# Patient Record
Sex: Male | Born: 1935 | Race: White | Hispanic: No | Marital: Married | Smoking: Never smoker
Health system: Southern US, Community
[De-identification: ages and names within clinical notes are randomized; demographics above are authoritative.]

## PROBLEM LIST (undated history)

## (undated) DIAGNOSIS — I10 Essential (primary) hypertension: Secondary | ICD-10-CM

## (undated) DIAGNOSIS — E119 Type 2 diabetes mellitus without complications: Secondary | ICD-10-CM

## (undated) HISTORY — PX: NEPHROSTOMY: SHX1014

## (undated) HISTORY — PX: COLON SURGERY: SHX602

## (undated) HISTORY — PX: OTHER SURGICAL HISTORY: SHX169

---

## 2009-06-16 ENCOUNTER — Ambulatory Visit: Payer: Self-pay | Admitting: Diagnostic Radiology

## 2009-06-16 ENCOUNTER — Emergency Department (HOSPITAL_BASED_OUTPATIENT_CLINIC_OR_DEPARTMENT_OTHER): Admission: EM | Admit: 2009-06-16 | Discharge: 2009-06-16 | Payer: Self-pay | Admitting: Emergency Medicine

## 2009-06-22 ENCOUNTER — Ambulatory Visit (HOSPITAL_COMMUNITY): Admission: RE | Admit: 2009-06-22 | Discharge: 2009-06-22 | Payer: Self-pay | Admitting: Oral Surgery

## 2010-08-16 LAB — CBC
HCT: 36.2 % — ABNORMAL LOW (ref 39.0–52.0)
Hemoglobin: 10.3 g/dL — ABNORMAL LOW (ref 13.0–17.0)
Hemoglobin: 12.7 g/dL — ABNORMAL LOW (ref 13.0–17.0)
MCHC: 35.1 g/dL (ref 30.0–36.0)
MCHC: 35.7 g/dL (ref 30.0–36.0)
MCV: 104.5 fL — ABNORMAL HIGH (ref 78.0–100.0)
Platelets: 176 K/uL (ref 150–400)
RBC: 2.74 MIL/uL — ABNORMAL LOW (ref 4.22–5.81)
RBC: 3.47 MIL/uL — ABNORMAL LOW (ref 4.22–5.81)
RDW: 11.8 % (ref 11.5–15.5)
WBC: 5.6 K/uL (ref 4.0–10.5)
WBC: 6.9 10*3/uL (ref 4.0–10.5)

## 2010-08-16 LAB — BASIC METABOLIC PANEL WITH GFR
CO2: 29 meq/L (ref 19–32)
Calcium: 9.5 mg/dL (ref 8.4–10.5)
Potassium: 4.5 meq/L (ref 3.5–5.1)
Sodium: 140 meq/L (ref 135–145)

## 2010-08-16 LAB — DIFFERENTIAL
Basophils Absolute: 0 10*3/uL (ref 0.0–0.1)
Basophils Absolute: 0 10*3/uL (ref 0.0–0.1)
Basophils Relative: 0 % (ref 0–1)
Eosinophils Absolute: 0.1 K/uL (ref 0.0–0.7)
Eosinophils Absolute: 0.2 10*3/uL (ref 0.0–0.7)
Eosinophils Relative: 1 % (ref 0–5)
Lymphocytes Relative: 20 % (ref 12–46)
Lymphs Abs: 1.1 10*3/uL (ref 0.7–4.0)
Lymphs Abs: 1.3 10*3/uL (ref 0.7–4.0)
Monocytes Absolute: 0.5 10*3/uL (ref 0.1–1.0)
Monocytes Relative: 9 % (ref 3–12)
Neutro Abs: 3.9 10*3/uL (ref 1.7–7.7)
Neutrophils Relative %: 67 % (ref 43–77)
Neutrophils Relative %: 70 % (ref 43–77)

## 2010-08-16 LAB — BASIC METABOLIC PANEL
BUN: 17 mg/dL (ref 6–23)
BUN: 20 mg/dL (ref 6–23)
CO2: 30 mEq/L (ref 19–32)
Chloride: 97 mEq/L (ref 96–112)
Creatinine, Ser: 1 mg/dL (ref 0.4–1.5)
GFR calc Af Amer: >60 mL/min (ref >60)
GFR calc non Af Amer: >60 mL/min (ref >60)
GFR calc non Af Amer: >60 mL/min (ref >60)
Glucose, Bld: 152 mg/dL — ABNORMAL HIGH (ref 70–99)
Glucose, Bld: 337 mg/dL — ABNORMAL HIGH (ref 70–99)
Sodium: 129 mEq/L — ABNORMAL LOW (ref 135–145)

## 2013-03-30 ENCOUNTER — Emergency Department (HOSPITAL_BASED_OUTPATIENT_CLINIC_OR_DEPARTMENT_OTHER): Payer: Medicare Other

## 2013-03-30 ENCOUNTER — Encounter (HOSPITAL_BASED_OUTPATIENT_CLINIC_OR_DEPARTMENT_OTHER): Payer: Self-pay | Admitting: Emergency Medicine

## 2013-03-30 ENCOUNTER — Emergency Department (HOSPITAL_BASED_OUTPATIENT_CLINIC_OR_DEPARTMENT_OTHER)
Admission: EM | Admit: 2013-03-30 | Discharge: 2013-03-30 | Disposition: A | Discharge status: Home / Self Care | Payer: Medicare Other | Attending: Emergency Medicine | Admitting: Emergency Medicine

## 2013-03-30 DIAGNOSIS — S0990XA Unspecified injury of head, initial encounter: Secondary | ICD-10-CM | POA: Insufficient documentation

## 2013-03-30 DIAGNOSIS — E119 Type 2 diabetes mellitus without complications: Secondary | ICD-10-CM | POA: Insufficient documentation

## 2013-03-30 DIAGNOSIS — S79919A Unspecified injury of unspecified hip, initial encounter: Secondary | ICD-10-CM | POA: Insufficient documentation

## 2013-03-30 DIAGNOSIS — Y9389 Activity, other specified: Secondary | ICD-10-CM | POA: Insufficient documentation

## 2013-03-30 DIAGNOSIS — S298XXA Other specified injuries of thorax, initial encounter: Secondary | ICD-10-CM | POA: Insufficient documentation

## 2013-03-30 DIAGNOSIS — S7000XA Contusion of unspecified hip, initial encounter: Secondary | ICD-10-CM | POA: Insufficient documentation

## 2013-03-30 DIAGNOSIS — Z936 Other artificial openings of urinary tract status: Secondary | ICD-10-CM | POA: Insufficient documentation

## 2013-03-30 DIAGNOSIS — Z8719 Personal history of other diseases of the digestive system: Secondary | ICD-10-CM | POA: Insufficient documentation

## 2013-03-30 DIAGNOSIS — Y92009 Unspecified place in unspecified non-institutional (private) residence as the place of occurrence of the external cause: Secondary | ICD-10-CM | POA: Insufficient documentation

## 2013-03-30 DIAGNOSIS — W1809XA Striking against other object with subsequent fall, initial encounter: Secondary | ICD-10-CM | POA: Insufficient documentation

## 2013-03-30 DIAGNOSIS — Z79899 Other long term (current) drug therapy: Secondary | ICD-10-CM | POA: Insufficient documentation

## 2013-03-30 DIAGNOSIS — W19XXXA Unspecified fall, initial encounter: Secondary | ICD-10-CM

## 2013-03-30 DIAGNOSIS — S300XXA Contusion of lower back and pelvis, initial encounter: Secondary | ICD-10-CM

## 2013-03-30 DIAGNOSIS — I10 Essential (primary) hypertension: Secondary | ICD-10-CM | POA: Insufficient documentation

## 2013-03-30 HISTORY — DX: Type 2 diabetes mellitus without complications: E11.9

## 2013-03-30 HISTORY — DX: Essential (primary) hypertension: I10

## 2013-03-30 LAB — URINALYSIS, ROUTINE W REFLEX MICROSCOPIC
Glucose, UA: NEGATIVE mg/dL
Ketones, ur: NEGATIVE mg/dL
Leukocytes, UA: NEGATIVE
Nitrite: NEGATIVE
Protein, ur: 30 mg/dL — AB
Urobilinogen, UA: 0.2 mg/dL (ref 0.0–1.0)

## 2013-03-30 LAB — URINE MICROSCOPIC-ADD ON

## 2013-03-30 MED ORDER — MORPHINE SULFATE 4 MG/ML IJ SOLN
4.0000 mg | Freq: Once | INTRAMUSCULAR | Status: AC
  Administered 2013-03-30: 4 mg via INTRAVENOUS
  Filled 2013-03-30: qty 1

## 2013-03-30 MED ORDER — HYDROCODONE-ACETAMINOPHEN 5-325 MG PO TABS: 1.0000 | ORAL_TABLET | Freq: Four times a day (QID) | ORAL | Status: DC | PRN

## 2013-03-30 MED ORDER — MORPHINE SULFATE 4 MG/ML IJ SOLN
6.0000 mg | Freq: Once | INTRAMUSCULAR | Status: AC
  Administered 2013-03-30: 6 mg via INTRAVENOUS
  Filled 2013-03-30: qty 2

## 2013-03-30 MED ORDER — MORPHINE SULFATE 2 MG/ML IJ SOLN
INTRAMUSCULAR | Status: AC
  Filled 2013-03-30: qty 1

## 2013-03-30 NOTE — ED Provider Notes (Signed)
CSN: [id number]     Arrival date & time 03/30/13  1334 History   First MD Initiated Contact with Patient 03/30/13 1426     Chief Complaint  Patient presents with  . Fall   (Consider location/radiation/quality/duration/timing/severity/associated sxs/prior Treatment) HPI Slipped and fell at his home one hour prior to coming here. Landing on his buttocks. After falling he struck his head. No loss of consciousness. He complains of mild occipital headache. Pain at right hip.(Points at iliac crest) which radiates to her right foot. He also complains of mild chest pain worse with changing positions since the event. Pain is improved with remaining still. He denies shortness of breath denies abdominal pain denies neck pain pain is worse with movement he was able to walk to the car with assistance. No other associated symptoms. No loss of bladder or bowel control Past Medical History  Diagnosis Date  . Diabetes mellitus without complication   . Hypertension    diverticulitis Past Surgical History  Procedure Laterality Date  . Colon surgery      removed 36 inches  . Nephrostomy    . Nephrostomy reversal     colostomy with reversal No family history on file. History  Substance Use Topics  . Smoking status: Never Smoker   . Smokeless tobacco: Not on file  . Alcohol Use: 0.0 oz/week    0 Glasses of wine per week    Review of Systems  Constitutional: Negative.   HENT: Negative.   Respiratory: Negative.   Cardiovascular: Positive for chest pain.  Gastrointestinal: Negative.   Musculoskeletal: Positive for arthralgias and back pain.       Pain and right hip  Skin: Negative.   Neurological: Negative.   Psychiatric/Behavioral: Negative.   All other systems reviewed and are negative.    Allergies  Oxycontin  Home Medications   Current Outpatient Rx  Name  Route  Sig  Dispense  Refill  . cholecalciferol (VITAMIN D) 1000 UNITS tablet   Oral   Take 2,000 Units by mouth daily.          . ferrous fumarate (HEMOCYTE - 106 MG FE) 325 (106 FE) MG TABS tablet   Oral   Take 1 tablet by mouth.         . losartan (COZAAR) 100 MG tablet   Oral   Take 100 mg by mouth daily.         . metFORMIN (GLUCOPHAGE) 500 MG tablet   Oral   Take 500 mg by mouth 2 (two) times daily with a meal.         . Multiple Vitamin (MULTIVITAMIN) capsule   Oral   Take 1 capsule by mouth daily.         Marland Kitchen omeprazole (PRILOSEC) 10 MG capsule   Oral   Take 10 mg by mouth daily.          BP 147/81  Pulse 73  Temp(Src) 98.3 F (36.8 C) (Oral)  Resp 18  Ht 5\' 7"  (1.702 m)  Wt 210 lb (95.255 kg)  BMI 32.88 kg/m2  SpO2 100% Physical Exam  Nursing note and vitals reviewed. Constitutional: He is oriented to person, place, and time. He appears well-developed and well-nourished. He appears distressed.  Appears uncomfortable Glasgow Coma Score 15.  HENT:  Head: Normocephalic and atraumatic.  Eyes: Conjunctivae are normal. Pupils are equal, round, and reactive to light.  Neck: Neck supple. No tracheal deviation present. No thyromegaly present.  Cardiovascular: Normal rate and regular rhythm.  No murmur heard. Pulmonary/Chest: Effort normal and breath sounds normal.  Pain anterior chest when he sits up from a supine position.  Abdominal: Soft. Bowel sounds are normal. He exhibits no distension. There is no tenderness.  Musculoskeletal: Normal range of motion. He exhibits no edema and no tenderness.  Cervical spine nontender, thoracic spine nontender. Positive paralumbar and parasacral tenderness. Pelvis is Tender overlying right iliac crest. Right lower extremity Mild pain at right hip upon internal rotation of the thigh. No deformity no swelling. Neurovascular intact. All other extremities are contusion abrasion or tenderness neurovascularly intact.  Neurological: He is alert and oriented to person, place, and time. He has normal reflexes. No cranial nerve deficit. Coordination normal.   Moves all extremities. DTRs symmetric bilaterally knee jerk ankle jerk biceps toes are bilaterally.  Skin: Skin is warm and dry. No rash noted.  Psychiatric: He has a normal mood and affect.    ED Course  Procedures (including critical care time) Labs Review Labs Reviewed - No data to display Imaging Review No results found.  EKG Interpretation   None      Results for orders placed during the hospital encounter of 06/22/09  GLUCOSE, CAPILLARY      Result Value Range   Glucose-Capillary 151 (*) 70 - 99 mg/dL  BASIC METABOLIC PANEL      Result Value Range   Sodium 129 (*) 135 - 145 mEq/L   Potassium 4.8  3.5 - 5.1 mEq/L   Chloride 93 (*) 96 - 112 mEq/L   CO2 30  19 - 32 mEq/L   Glucose, Bld 152 (*) 70 - 99 mg/dL   BUN 17  6 - 23 mg/dL   Creatinine, Ser 8.11  0.4 - 1.5 mg/dL   Calcium 8.7  8.4 - 91.4 mg/dL   GFR calc non Af Amer >60  >60 mL/min   GFR calc Af Amer    >60 mL/min   Value: >60            The eGFR has been calculated     using the MDRD equation.     This calculation has not been     validated in all clinical     situations.     eGFR's persistently     <60 mL/min signify     possible Chronic Kidney Disease.  CBC      Result Value Range   WBC 6.9  4.0 - 10.5 K/uL   RBC 2.74 (*) 4.22 - 5.81 MIL/uL   Hemoglobin 10.3 (*) 13.0 - 17.0 g/dL   HCT 78.2 (*) 95.6 - 21.3 %   MCV 105.4 (*) 78.0 - 100.0 fL   MCHC 35.7  30.0 - 36.0 g/dL   RDW 08.6  57.8 - 46.9 %   Platelets 181  150 - 400 K/uL  DIFFERENTIAL      Result Value Range   Neutrophils Relative % 67  43 - 77 %   Neutro Abs 4.7  1.7 - 7.7 K/uL   Lymphocytes Relative 19  12 - 46 %   Lymphs Abs 1.3  0.7 - 4.0 K/uL   Monocytes Relative 11  3 - 12 %   Monocytes Absolute 0.7  0.1 - 1.0 K/uL   Eosinophils Relative 3  0 - 5 %   Eosinophils Absolute 0.2  0.0 - 0.7 K/uL   Basophils Relative 0  0 - 1 %   Basophils Absolute 0.0  0.0 - 0.1 K/uL  GLUCOSE, CAPILLARY  Result Value Range   Glucose-Capillary  132 (*) 70 - 99 mg/dL   Dg Chest 2 View  16/05/958   CLINICAL DATA:  Recent traumatic injury.  EXAM: CHEST  2 VIEW  COMPARISON:  None  FINDINGS: The cardiac shadow is within normal limits. The lungs are clear bilaterally. Old rib fractures are noted on the left. No acute bony abnormality is noted.  IMPRESSION: No active cardiopulmonary disease.   Electronically Signed   By: Alcide Clever M.D.   On: 03/30/2013 15:44   Dg Lumbar Spine Complete  03/30/2013   CLINICAL DATA:  Recent injury with low back pain  EXAM: LUMBAR SPINE - COMPLETE 4+ VIEW  COMPARISON:  None.  FINDINGS: Five lumbar type vertebral bodies are well visualized. Vertebral body height is well maintained. No spondylolysis or spondylolisthesis is seen. Mild osteophytic changes are noted.  IMPRESSION: Degenerative change without acute abnormality.   Electronically Signed   By: Alcide Clever M.D.   On: 03/30/2013 15:45   Dg Hip Complete Right  03/30/2013   CLINICAL DATA:  Recent traumatic injury with hip pain  EXAM: RIGHT HIP - COMPLETE 2+ VIEW  COMPARISON:  None.  FINDINGS: Pelvic ring is intact. No acute fracture or dislocation is noted. Mild degenerative changes of the hip joints are seen.  IMPRESSION: No acute abnormality is noted.   Electronically Signed   By: Alcide Clever M.D.   On: 03/30/2013 15:43   Ct Pelvis Wo Contrast  03/30/2013   CLINICAL DATA:  Fall, right hip pain  EXAM: CT PELVIS WITHOUT CONTRAST  TECHNIQUE: Multidetector CT imaging of the pelvis was performed following the standard protocol without intravenous contrast.  COMPARISON:  Right hip radiographs same date  FINDINGS: Disk degenerative changes noted in the lower lumbar spine. Sacroiliac joints are unremarkable. No displaced sacral fracture is seen. No fracture is identified in the bony pelvis or either proximal femur. Bladder diverticuli are noted. No pelvic free fluid, free air, or lymphadenopathy. Visualized bowel is unremarkable.  IMPRESSION: No acute pelvic fracture  or femoral fracture identified.  Bladder diverticuli.   Electronically Signed   By: Christiana Pellant M.D.   On: 03/30/2013 16:19    X-rays viewed by me 3:50 PM pain improved after treatment with intravenous morphine. MDM  No diagnosis found. Pt signed out to Dr Romeo Apple 355 pm. CT pelvis ordered as pt with significant pain inorder to check for occult fraccture after discussion with radiologist     Doug Sou, MD 03/30/13 1623

## 2013-03-30 NOTE — ED Provider Notes (Signed)
4:17 PM Accepted care from Dr. Ethelda Chick. 84M w/ mechanical fall, awaiting imaging.   5:49 PM: Pt is ambulatory, feeling better. I interpreted/reviewed the labs and/or imaging which were non-contributory.  I have discussed the diagnosis/risks/treatment options with the patient and family and believe the pt to be eligible for discharge home to follow-up with pcp as needed. We also discussed returning to the ED immediately if new or worsening sx occur. We discussed the sx which are most concerning (e.g., worsening pain) that necessitate immediate return. Any new prescriptions provided to the patient are listed below.  New Prescriptions   HYDROCODONE-ACETAMINOPHEN (NORCO/VICODIN) 5-325 MG PER TABLET    Take 1 tablet by mouth every 6 (six) hours as needed for pain.   Clinical Impression 1. Fall, initial encounter   2. Pelvic contusion, initial encounter      Junius Argyle, MD 03/30/13 1752

## 2013-03-30 NOTE — ED Notes (Signed)
Pt fell about an hour ago, landing on buttocks, lower back and head, pain is right leg and up through back towards shoulder blades. Mostly concentrated around hip bone. Denies blood thinners, sts had dull headache that is resolving (took 500mg  tylenol). Iced back immediately.

## 2015-06-05 DIAGNOSIS — G629 Polyneuropathy, unspecified: Secondary | ICD-10-CM | POA: Insufficient documentation

## 2015-06-05 DIAGNOSIS — E669 Obesity, unspecified: Secondary | ICD-10-CM | POA: Insufficient documentation

## 2015-06-05 DIAGNOSIS — D539 Nutritional anemia, unspecified: Secondary | ICD-10-CM | POA: Diagnosis present

## 2015-06-05 DIAGNOSIS — E559 Vitamin D deficiency, unspecified: Secondary | ICD-10-CM | POA: Insufficient documentation

## 2015-06-05 DIAGNOSIS — N1330 Unspecified hydronephrosis: Secondary | ICD-10-CM | POA: Insufficient documentation

## 2015-06-05 DIAGNOSIS — D509 Iron deficiency anemia, unspecified: Secondary | ICD-10-CM | POA: Diagnosis present

## 2015-06-05 DIAGNOSIS — G4733 Obstructive sleep apnea (adult) (pediatric): Secondary | ICD-10-CM | POA: Diagnosis present

## 2015-08-03 DIAGNOSIS — E785 Hyperlipidemia, unspecified: Secondary | ICD-10-CM | POA: Diagnosis present

## 2015-08-04 IMAGING — CR DG CHEST 2V
3 series · 3 of 3 positions shown · non-contrast
Comparison: None

CLINICAL DATA: Recent traumatic injury.

EXAM:
CHEST  2 VIEW

[w chest lat (1 of 2)]
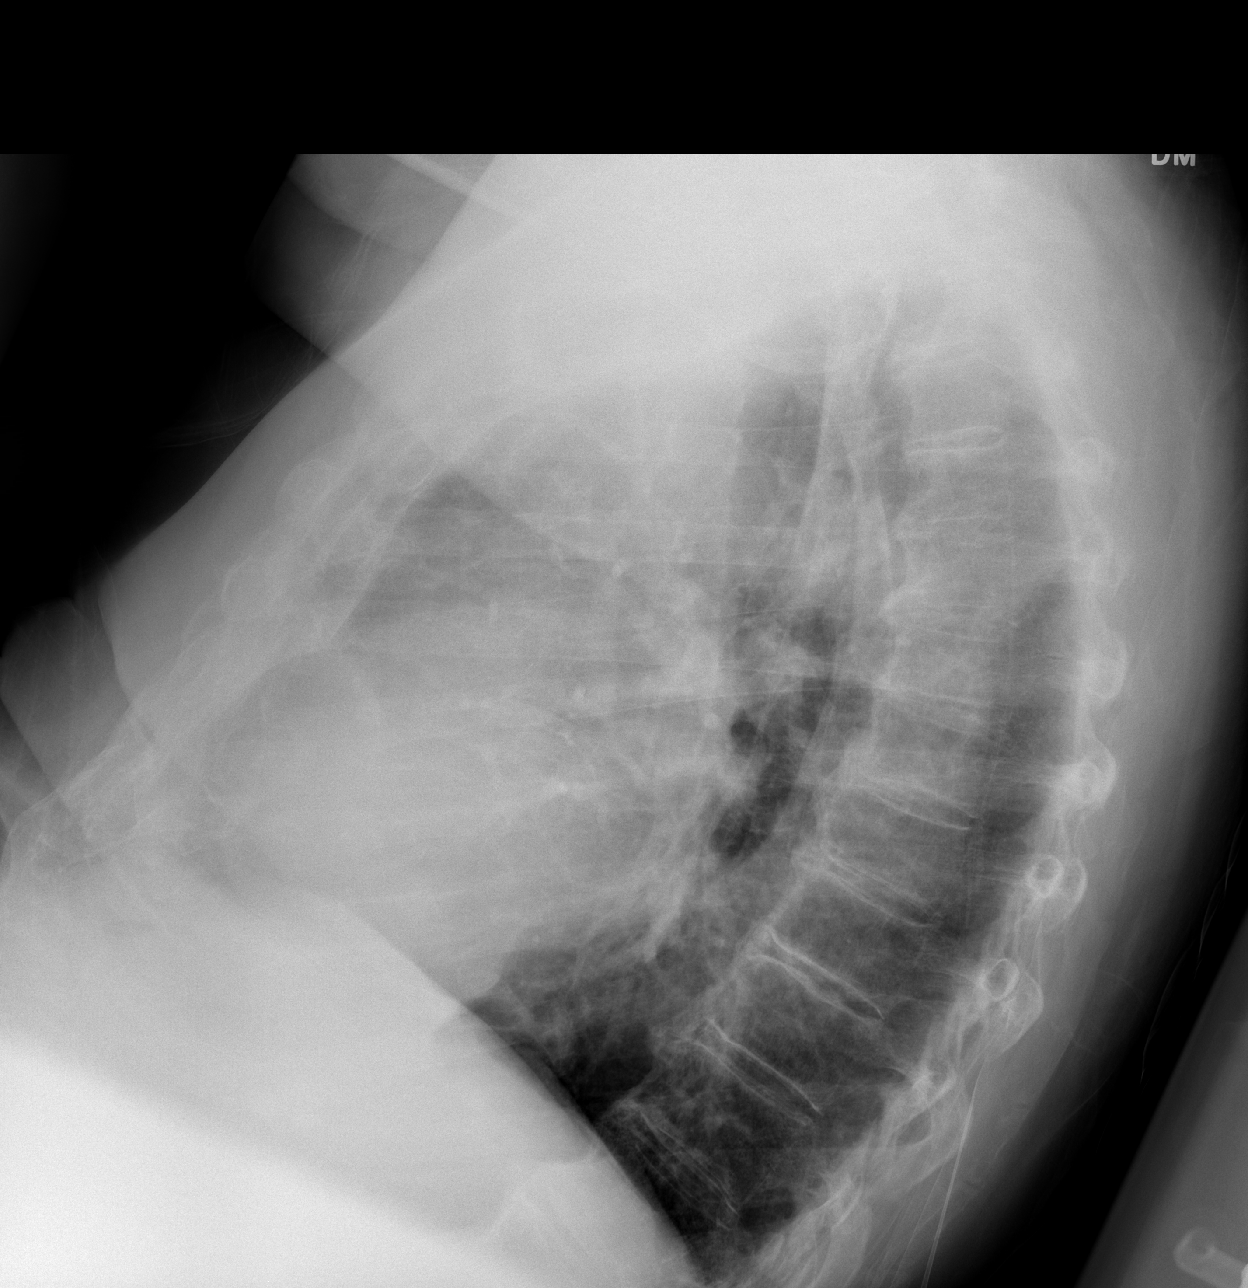

[w chest lat (2 of 2)]
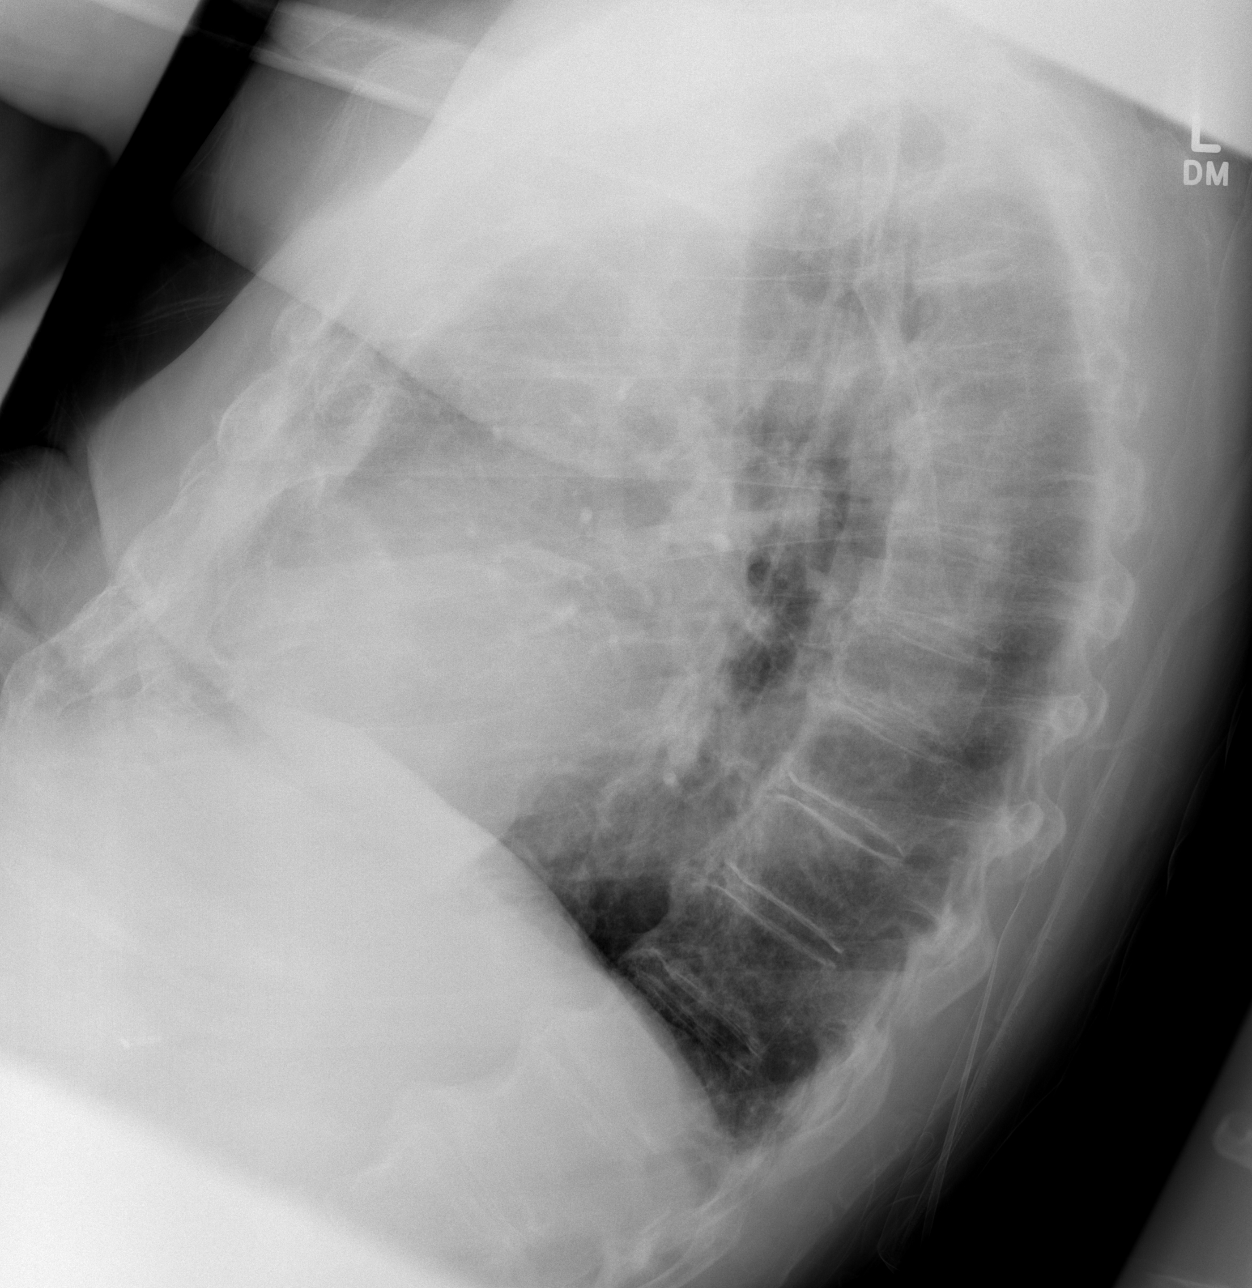

[t chest supine]
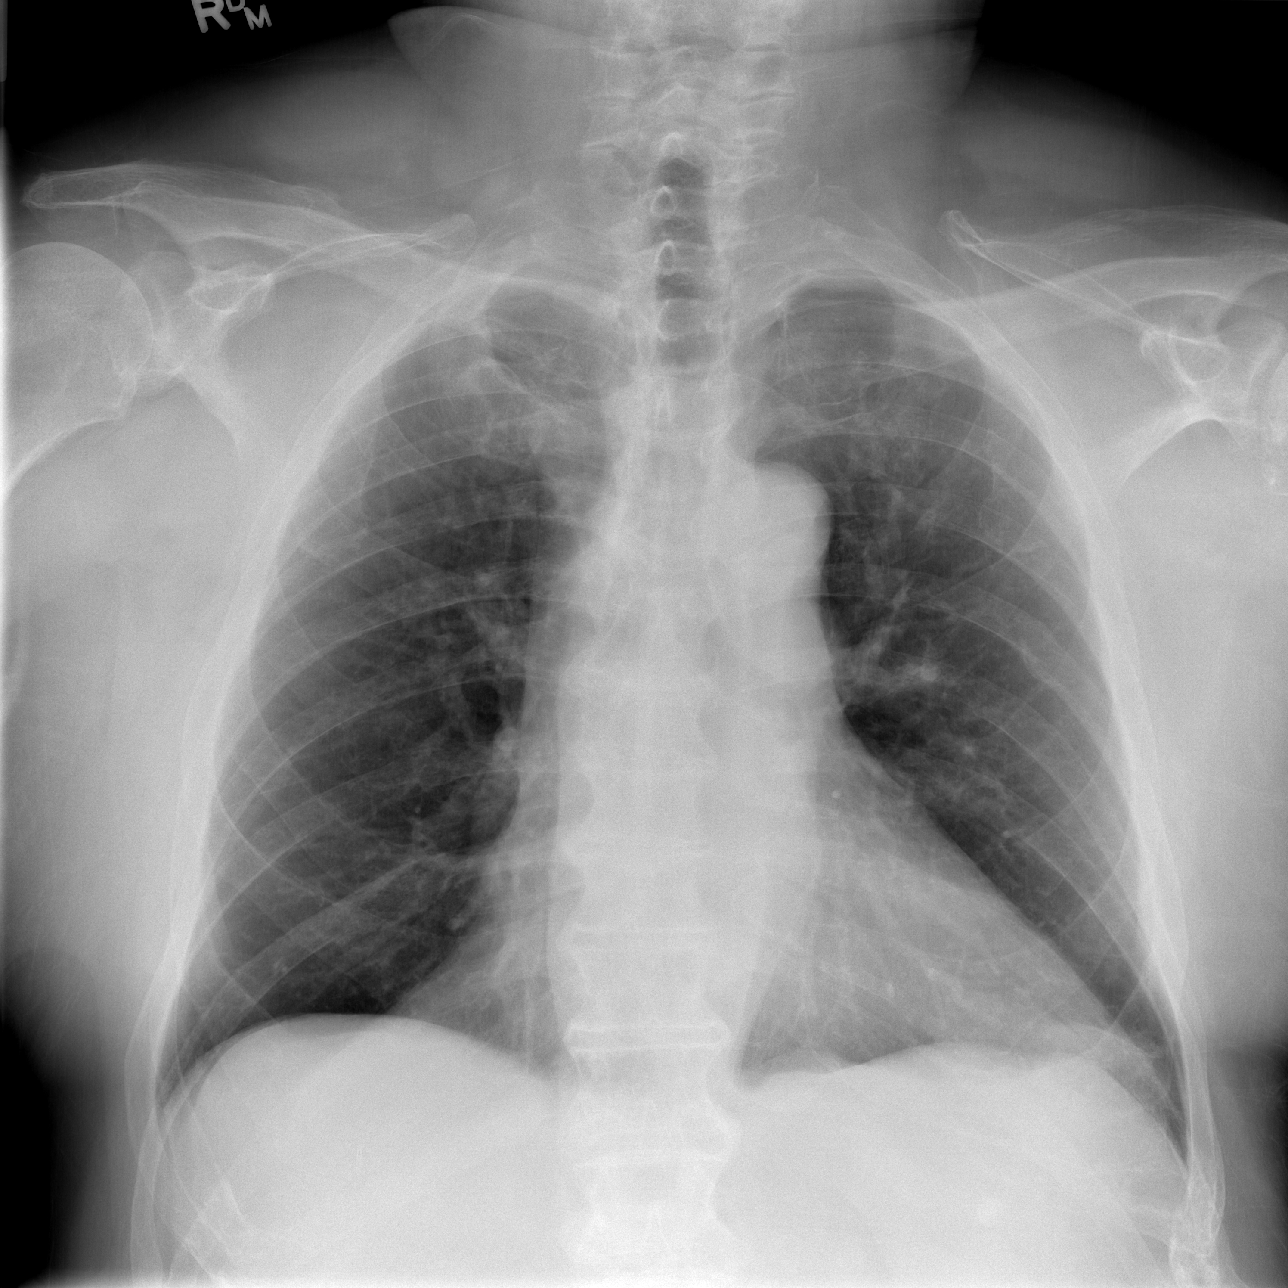

[3 of 3 positions shown; findings below may reference images not displayed]

FINDINGS: The cardiac shadow is within normal limits. The lungs are clear
bilaterally. Old rib fractures are noted on the left. No acute bony
abnormality is noted.
IMPRESSION: No active cardiopulmonary disease.

## 2015-08-04 IMAGING — CR DG LUMBAR SPINE COMPLETE 4+V
5 series · 5 of 5 positions shown · non-contrast
Comparison: None.

CLINICAL DATA: Recent injury with low back pain

EXAM:
LUMBAR SPINE - COMPLETE 4+ VIEW

[t l-spine a.p.]
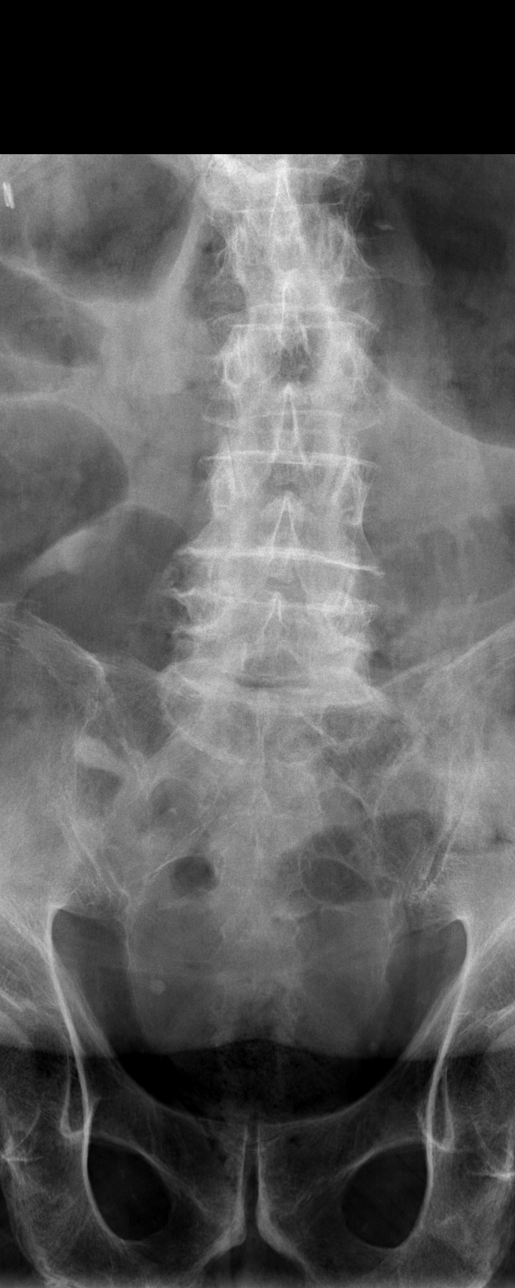

[t l-spine oblique exposure (1 of 2)]
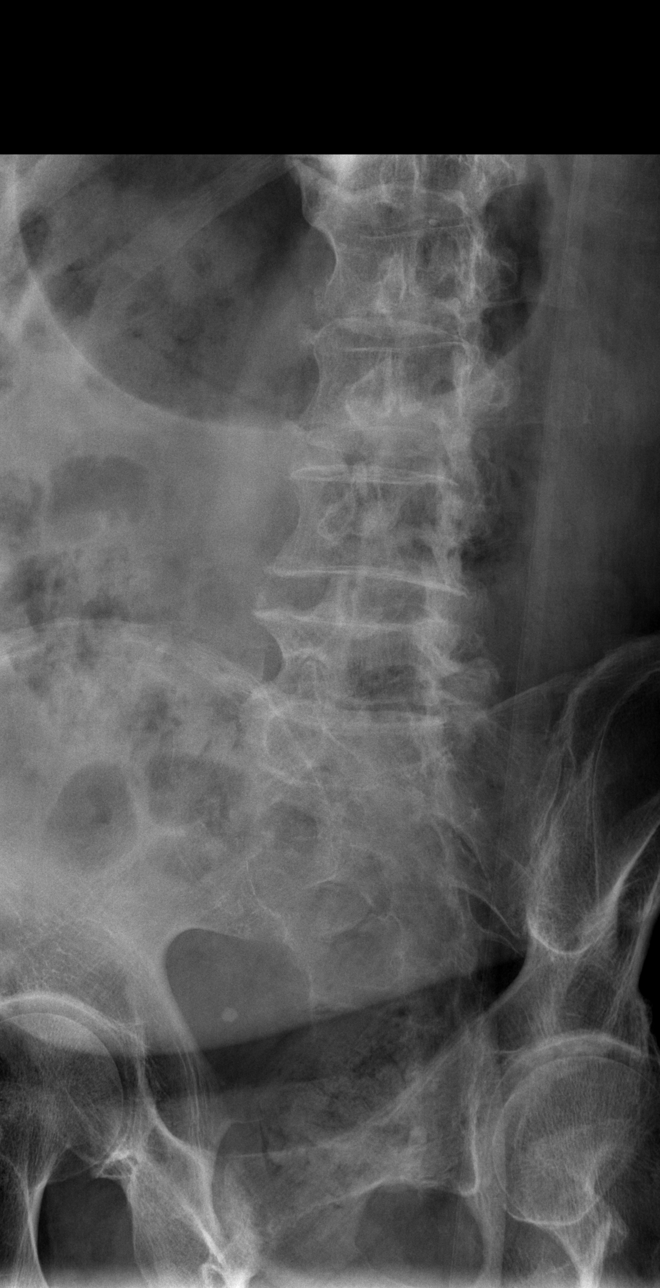

[t l-spine oblique exposure (2 of 2)]
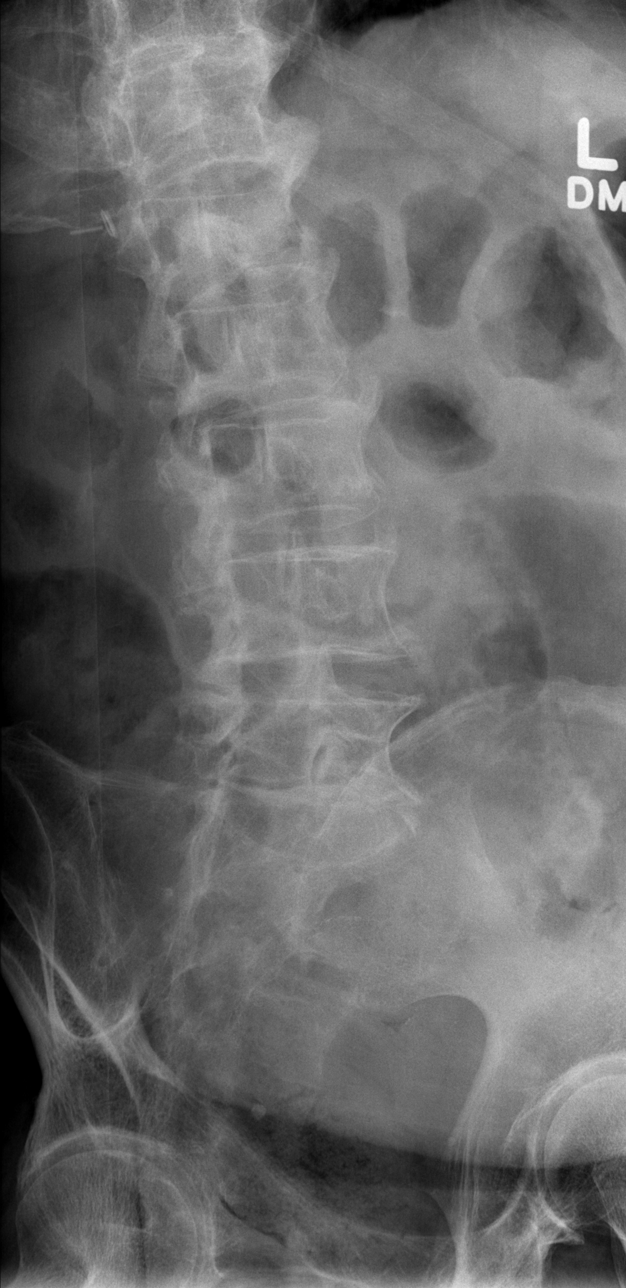

[t l-spine lat]
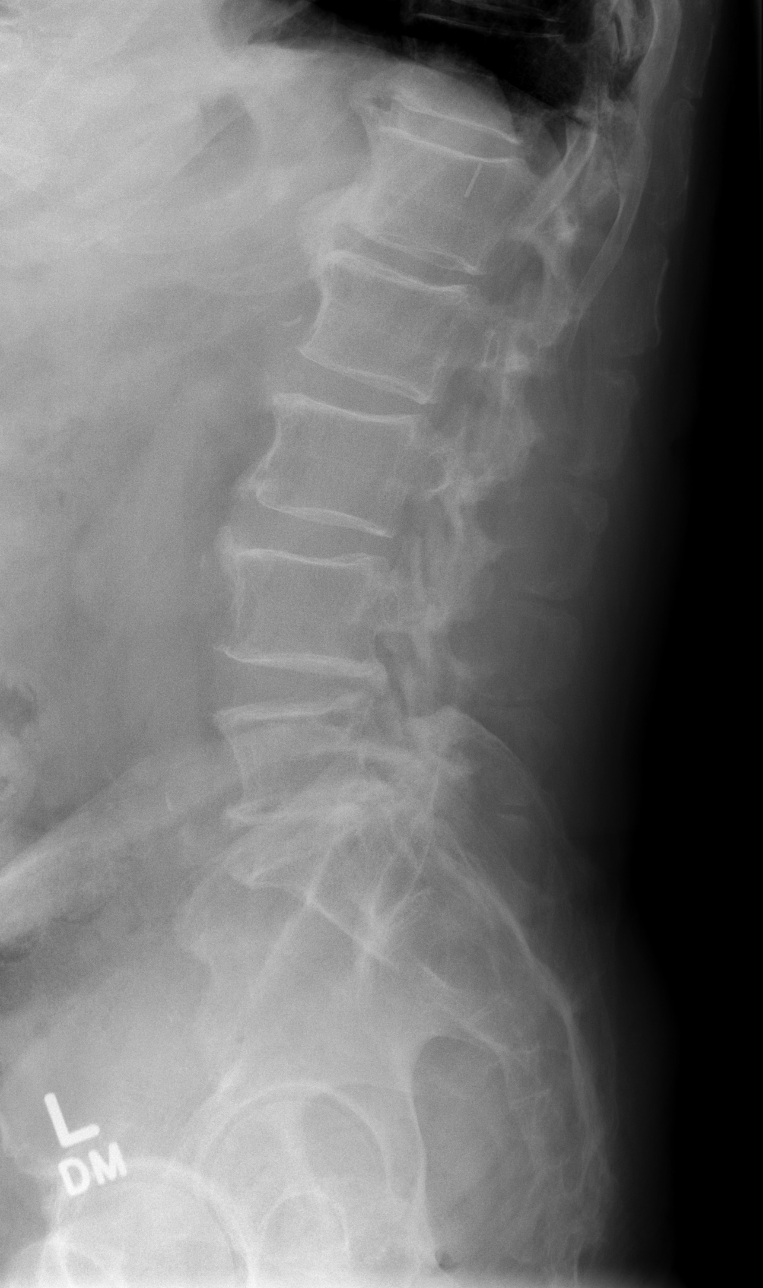

[t l-spine l5-s1 spot]
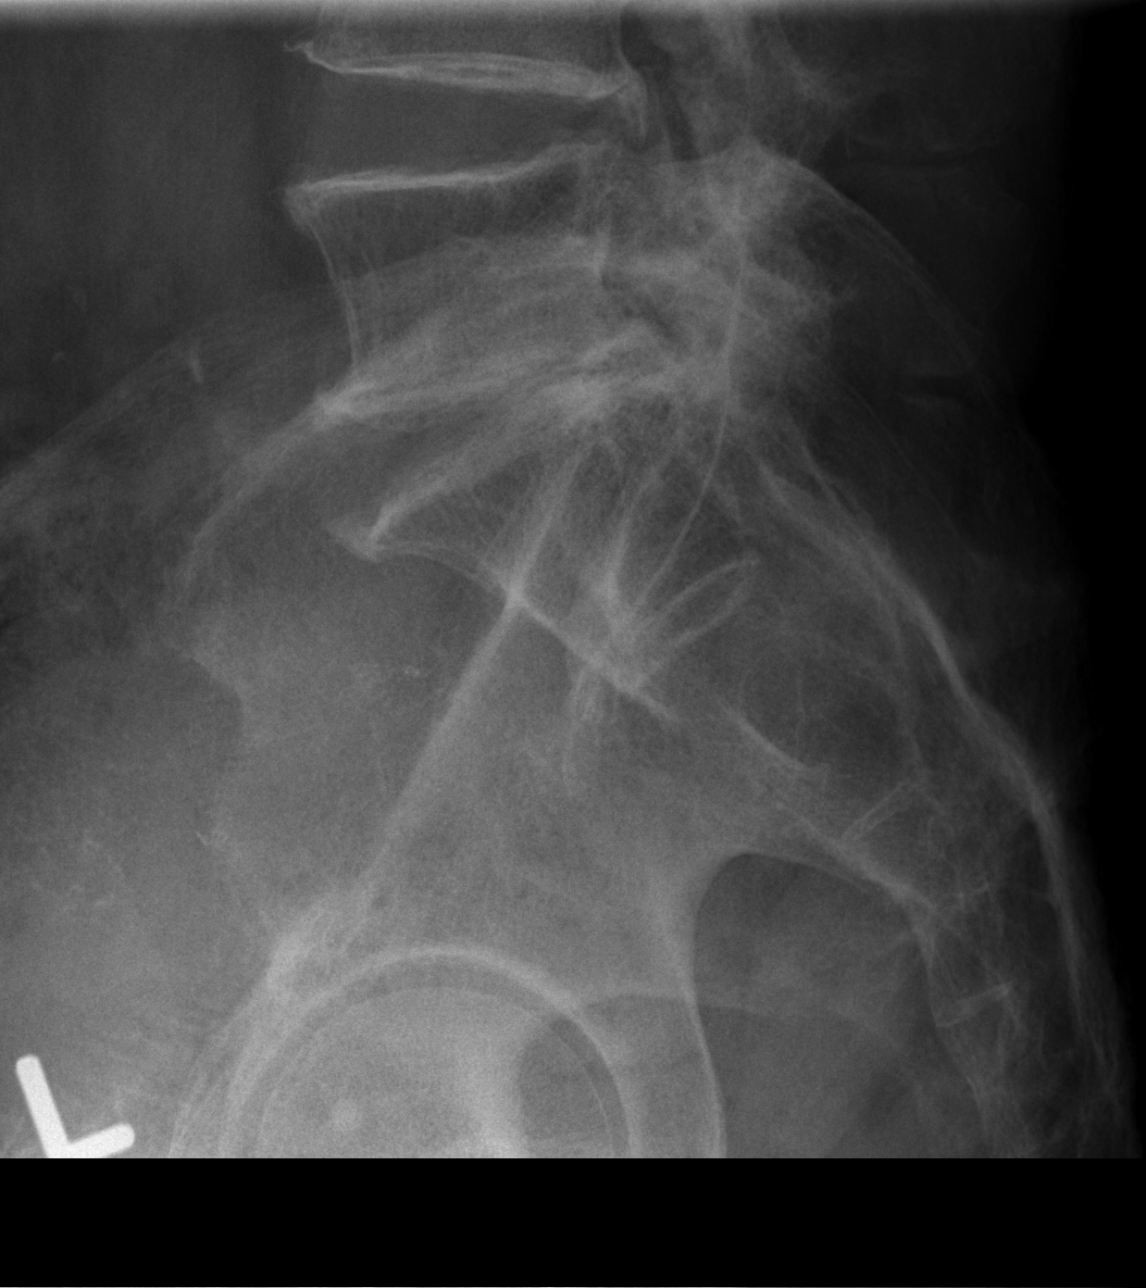

[5 of 5 positions shown; findings below may reference images not displayed]

FINDINGS: Five lumbar type vertebral bodies are well visualized. Vertebral
body height is well maintained. No spondylolysis or
spondylolisthesis is seen. Mild osteophytic changes are noted.
IMPRESSION: Degenerative change without acute abnormality.

## 2015-08-17 DIAGNOSIS — J4520 Mild intermittent asthma, uncomplicated: Secondary | ICD-10-CM | POA: Insufficient documentation

## 2016-02-29 DIAGNOSIS — E876 Hypokalemia: Secondary | ICD-10-CM | POA: Insufficient documentation

## 2016-03-01 DIAGNOSIS — I2510 Atherosclerotic heart disease of native coronary artery without angina pectoris: Secondary | ICD-10-CM | POA: Diagnosis present

## 2016-03-02 DIAGNOSIS — E1165 Type 2 diabetes mellitus with hyperglycemia: Secondary | ICD-10-CM | POA: Diagnosis present

## 2016-03-02 DIAGNOSIS — I10 Essential (primary) hypertension: Secondary | ICD-10-CM | POA: Diagnosis present

## 2016-03-02 DIAGNOSIS — E1159 Type 2 diabetes mellitus with other circulatory complications: Secondary | ICD-10-CM | POA: Insufficient documentation

## 2020-10-27 DIAGNOSIS — G301 Alzheimer's disease with late onset: Secondary | ICD-10-CM | POA: Diagnosis present

## 2022-06-16 ENCOUNTER — Encounter (HOSPITAL_COMMUNITY): Payer: Self-pay

## 2022-06-16 ENCOUNTER — Emergency Department (HOSPITAL_COMMUNITY)
Admission: EM | Admit: 2022-06-16 | Discharge: 2022-06-16 | Disposition: A | Discharge status: Home / Self Care | Payer: Medicare Other | Attending: Emergency Medicine | Admitting: Emergency Medicine

## 2022-06-16 ENCOUNTER — Other Ambulatory Visit: Payer: Self-pay

## 2022-06-16 ENCOUNTER — Emergency Department (HOSPITAL_COMMUNITY): Payer: Medicare Other

## 2022-06-16 DIAGNOSIS — F0390 Unspecified dementia without behavioral disturbance: Secondary | ICD-10-CM | POA: Diagnosis not present

## 2022-06-16 DIAGNOSIS — R079 Chest pain, unspecified: Secondary | ICD-10-CM | POA: Diagnosis not present

## 2022-06-16 DIAGNOSIS — D649 Anemia, unspecified: Secondary | ICD-10-CM | POA: Diagnosis not present

## 2022-06-16 DIAGNOSIS — E871 Hypo-osmolality and hyponatremia: Secondary | ICD-10-CM | POA: Diagnosis not present

## 2022-06-16 LAB — CBC
HCT: 31.7 % — ABNORMAL LOW (ref 39.0–52.0)
Hemoglobin: 11.3 g/dL — ABNORMAL LOW (ref 13.0–17.0)
MCH: 39.4 pg — ABNORMAL HIGH (ref 26.0–34.0)
MCHC: 35.6 g/dL (ref 30.0–36.0)
MCV: 110.5 fL — ABNORMAL HIGH (ref 80.0–100.0)
Platelets: 164 10*3/uL (ref 150–400)
RBC: 2.87 MIL/uL — ABNORMAL LOW (ref 4.22–5.81)
RDW: 12.1 % (ref 11.5–15.5)
WBC: 6.4 10*3/uL (ref 4.0–10.5)
nRBC: 0 % (ref 0.0–0.2)

## 2022-06-16 LAB — BASIC METABOLIC PANEL
Anion gap: 12 (ref 5–15)
BUN: 28 mg/dL — ABNORMAL HIGH (ref 8–23)
CO2: 22 mmol/L (ref 22–32)
Calcium: 9 mg/dL (ref 8.9–10.3)
Chloride: 97 mmol/L — ABNORMAL LOW (ref 98–111)
Creatinine, Ser: 1.2 mg/dL (ref 0.61–1.24)
GFR, Estimated: 59 mL/min — ABNORMAL LOW (ref >60)
Glucose, Bld: 107 mg/dL — ABNORMAL HIGH (ref 70–99)
Potassium: 4.7 mmol/L (ref 3.5–5.1)
Sodium: 131 mmol/L — ABNORMAL LOW (ref 135–145)

## 2022-06-16 LAB — TROPONIN I (HIGH SENSITIVITY)
Troponin I (High Sensitivity): 19 ng/L — ABNORMAL HIGH (ref <18)
Troponin I (High Sensitivity): 22 ng/L — ABNORMAL HIGH (ref <18)

## 2022-06-16 MED ORDER — QUETIAPINE FUMARATE 25 MG PO TABS
25.0000 mg | ORAL_TABLET | Freq: Once | ORAL | Status: AC
  Administered 2022-06-16: 25 mg via ORAL
  Filled 2022-06-16: qty 1

## 2022-06-16 NOTE — ED Triage Notes (Signed)
Patient presents C/O SOB, CP and bilateral shoulder pain. EMS states he changed his complaint to just shoulder pain en route. Patient is coming from [facility]. Pt has hx of Dementia and is oriented to person.

## 2022-06-16 NOTE — ED Provider Notes (Signed)
[facility] EMERGENCY DEPARTMENT Provider Note   CSN: [id number] Arrival date & time: 06/16/22  1607     History  No chief complaint on file.   Benjamin Jackson is a 89 y.o. male.  Level 5 caveat secondary to dementia.  Patient brought in by EMS from his facility for evaluation of possible chest pain shoulder pain.  Patient cannot state when it started although he does endorse that he has chest pain now but that it is leaving him.  He cannot tell me if he has had it before.  The history is provided by the patient and the EMS personnel.  Chest Pain Pain location:  L chest Pain radiates to:  L shoulder and R shoulder Progression:  Partially resolved Associated symptoms: shortness of breath   Associated symptoms: no cough and no vomiting   Risk factors: diabetes mellitus and hypertension        Home Medications Prior to Admission medications   Medication Sig Start Date End Date Taking? Authorizing Provider  cholecalciferol (VITAMIN D) 1000 UNITS tablet Take 2,000 Units by mouth daily.    [provider]  ferrous fumarate (HEMOCYTE - 106 MG FE) 325 (106 FE) MG TABS tablet Take 1 tablet by mouth.    [provider]  HYDROcodone-acetaminophen (NORCO/VICODIN) 5-325 MG per tablet Take 1 tablet by mouth every 6 (six) hours as needed for pain. 03/30/13   Purvis Sheffield, MD  losartan (COZAAR) 100 MG tablet Take 100 mg by mouth daily.    [provider]  metFORMIN (GLUCOPHAGE) 500 MG tablet Take 500 mg by mouth 2 (two) times daily with a meal.    [provider]  Multiple Vitamin (MULTIVITAMIN) capsule Take 1 capsule by mouth daily.    [provider]  omeprazole (PRILOSEC) 10 MG capsule Take 10 mg by mouth daily.    [provider]      Allergies    Oxycontin [oxycodone hcl]    Review of Systems   Review of Systems  Unable to perform ROS: Dementia  Respiratory:  Positive for shortness of breath. Negative for  cough.   Cardiovascular:  Positive for chest pain.  Gastrointestinal:  Negative for vomiting.    Physical Exam Updated Vital Signs BP 118/74 (BP Location: Right Arm)   Pulse 75   Temp 98.5 F (36.9 C) (Oral)   Resp 16   SpO2 98%  Physical Exam Vitals and nursing note reviewed.  Constitutional:      General: He is not in acute distress.    Appearance: Normal appearance. He is well-developed.  HENT:     Head: Normocephalic and atraumatic.  Eyes:     Conjunctiva/sclera: Conjunctivae normal.  Cardiovascular:     Rate and Rhythm: Normal rate and regular rhythm.     Heart sounds: No murmur heard. Pulmonary:     Effort: Pulmonary effort is normal. No respiratory distress.     Breath sounds: Normal breath sounds.  Abdominal:     Palpations: Abdomen is soft.     Tenderness: There is no abdominal tenderness. There is no guarding or rebound.  Musculoskeletal:     Cervical back: Neck supple.     Right lower leg: No edema.     Left lower leg: No edema.     Comments: He has a well-healed surgical scar over his anterior left shoulder with some muscle loss.  No overlying erythema  Skin:    General: Skin is warm and dry.  Capillary Refill: Capillary refill takes less than 2 seconds.  Neurological:     General: No focal deficit present.     Mental Status: He is alert. He is disoriented.     Sensory: No sensory deficit.     Motor: No weakness.     ED Results / Procedures / Treatments   Labs (all labs ordered are listed, but only abnormal results are displayed) Labs Reviewed  CBC - Abnormal; Notable for the following components:      Result Value   RBC 2.87 (*)    Hemoglobin 11.3 (*)    HCT 31.7 (*)    MCV 110.5 (*)    MCH 39.4 (*)    All other components within normal limits  BASIC METABOLIC PANEL - Abnormal; Notable for the following components:   Sodium 131 (*)    Chloride 97 (*)    Glucose, Bld 107 (*)    BUN 28 (*)    GFR, Estimated 59 (*)    All other components  within normal limits  TROPONIN I (HIGH SENSITIVITY) - Abnormal; Notable for the following components:   Troponin I (High Sensitivity) 19 (*)    All other components within normal limits  TROPONIN I (HIGH SENSITIVITY) - Abnormal; Notable for the following components:   Troponin I (High Sensitivity) 22 (*)    All other components within normal limits    EKG EKG Interpretation  Date/Time:  Thursday June 16 2022 16:20:48 EST Ventricular Rate:  63 PR Interval:  54 QRS Duration: 103 QT Interval:  456 QTC Calculation: 467 R Axis:   -48 Text Interpretation: Sinus rhythm Short PR interval Consider right atrial enlargement LAD, consider left anterior fascicular block Abnormal R-wave progression, early transition Artifact in lead(s) I aVL V1 V2 possible lateral ischemia from prior 1/11 Confirmed by Aletta Edouard [phone]) on 06/16/2022 4:22:52 PM  Radiology DG Chest Port 1 View  Result Date: 06/16/2022 CLINICAL DATA:  Chest pain.  Shortness of breath. EXAM: PORTABLE CHEST 1 VIEW COMPARISON:  AP chest 12/18/2018 and 11/02/2018 FINDINGS: Cardiac silhouette is again moderately enlarged. Mediastinal contours are unchanged and within normal limits. Mild calcification within aortic arch. The lungs are clear. No pleural effusion pneumothorax. ACDF hardware overlies the mid to inferior cervical spine. 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 Old healed posterior left seventh rib fracture. IMPRESSION: 1. No acute cardiopulmonary process. 2. Unchanged cardiomegaly. Electronically Signed   By: Yvonne Kendall M.D.   On: 06/16/2022 16:48    Procedures Procedures    Medications Ordered in ED Medications  QUEtiapine (SEROQUEL) tablet 25 mg (25 mg Oral Given 06/16/22 1900)    ED Course/ Medical Decision Making/ A&P Clinical Course as of 06/17/22 0953  Thu Jun 16, 2022  1655 Chest x-ray interpreted by me as no acute infiltrates.  Awaiting radiology reading. [MB]  4098 Informed by  nurse patient is getting more agitated he is ripped off his IVs and leads.  His wife is here now.  She said he is at his baseline.  She is comfortable plan for return back to his memory care unit if it is appropriate medically [MB]  1950 Patient's wife is here.  I misunderstood and thought he was at a memory care facility but he is only there during the day.  He lives at home with her.  She is comfortable plan for taking him home but she wants to make sure he is awake enough to be able to assist. [MB]  Clinical Course User Index [MB] Hayden Rasmussen, MD                             Medical Decision Making Amount and/or Complexity of Data Reviewed Labs: ordered. Radiology: ordered.  Risk Prescription drug management.   This patient complains of possible chest and shoulder pain; this involves an extensive number of treatment Options and is a complaint that carries with it a high risk of complications and morbidity. The differential includes musculoskeletal pain, reflux, ACS, pneumonia, pneumothorax, PE  I ordered, reviewed and interpreted labs, which included CBC with normal white count, hemoglobin slightly lower than priors, chemistries with low sodium normal renal function, troponins mildly elevated but flat I ordered medication oral Seroquel for agitation and reviewed PMP when indicated. I ordered imaging studies which included chest x-ray and I independently    visualized and interpreted imaging which showed no acute findings Additional history obtained from EMS and patient's wife Previous records obtained and reviewed in epic no recent admissions  Cardiac monitoring reviewed, sinus rhythm Social determinants considered, no significant barriers although with dementia patient unable to advocate for self Critical Interventions: None  After the interventions stated above, I reevaluated the patient and found patient to be resting quietly no distress Admission and further testing  considered, no indications for admission at this time.  Wife would like to take patient home.  Will get him up and dressed to make sure he is appropriate for transport back to home in her vehicle.  Recommended close follow-up with PCP.         Final Clinical Impression(s) / ED Diagnoses Final diagnoses:  Nonspecific chest pain    Rx / DC Orders ED Discharge Orders     None         Hayden Rasmussen, MD 06/17/22 [id number]

## 2022-06-16 NOTE — Discharge Instructions (Signed)
You are seen in the emergency department for chest and shoulder pain.  You had blood work EKG and chest x-ray that did not show an obvious explanation for your symptoms.  Please continue your regular medications and follow-up with your primary care doctor.  Return to the emergency department if any worsening or concerning symptoms.

## 2022-06-16 NOTE — ED Notes (Signed)
Patient onlyatSNFduring day per wife- Wife requestingto transport patietnhome via POV

## 2023-12-16 ENCOUNTER — Emergency Department (HOSPITAL_COMMUNITY)
Admission: EM | Admit: 2023-12-16 | Discharge: 2023-12-16 | Disposition: A | Discharge status: Home / Self Care | Attending: Emergency Medicine | Admitting: Emergency Medicine

## 2023-12-16 ENCOUNTER — Emergency Department (HOSPITAL_COMMUNITY)

## 2023-12-16 ENCOUNTER — Encounter (HOSPITAL_COMMUNITY): Payer: Self-pay | Admitting: *Deleted

## 2023-12-16 ENCOUNTER — Other Ambulatory Visit: Payer: Self-pay

## 2023-12-16 DIAGNOSIS — R4182 Altered mental status, unspecified: Secondary | ICD-10-CM | POA: Diagnosis present

## 2023-12-16 DIAGNOSIS — S060X0A Concussion without loss of consciousness, initial encounter: Secondary | ICD-10-CM | POA: Insufficient documentation

## 2023-12-16 DIAGNOSIS — W19XXXA Unspecified fall, initial encounter: Secondary | ICD-10-CM | POA: Insufficient documentation

## 2023-12-16 DIAGNOSIS — S5002XA Contusion of left elbow, initial encounter: Secondary | ICD-10-CM | POA: Diagnosis not present

## 2023-12-16 DIAGNOSIS — Z7982 Long term (current) use of aspirin: Secondary | ICD-10-CM | POA: Insufficient documentation

## 2023-12-16 DIAGNOSIS — S40011A Contusion of right shoulder, initial encounter: Secondary | ICD-10-CM | POA: Diagnosis not present

## 2023-12-16 LAB — URINALYSIS, ROUTINE W REFLEX MICROSCOPIC
Bacteria, UA: NONE SEEN
Bilirubin Urine: NEGATIVE
Glucose, UA: NEGATIVE mg/dL
Ketones, ur: NEGATIVE mg/dL
Leukocytes,Ua: NEGATIVE
Nitrite: NEGATIVE
Protein, ur: 100 mg/dL — AB
RBC / HPF: >50 RBC/hpf (ref 0–5)
Specific Gravity, Urine: 1.015 (ref 1.005–1.030)
pH: 7 (ref 5.0–8.0)

## 2023-12-16 NOTE — ED Triage Notes (Addendum)
 Patient presents to ed via [facility] from [place] , per ems patient has a fall yest and was seen at [facility]  there was question about CT scan however patient was discharged  at 0600 am today. Staff at [facility] states patient was not at his baseline, staff states  patient had tremors this am of which isn't normal for him. They wanted patient re-eval . Patient has what appears  to be healing bruise to inner aspect of left elbow and bruise to posterior right shoulder

## 2023-12-16 NOTE — ED Notes (Signed)
 Called pt's wife Avelina to update on results and discharge status.

## 2023-12-16 NOTE — Discharge Instructions (Addendum)
 Repeat head CT was normal today. Your urine does not show urinary tract infection. Please follow-up with your primary care doctor.

## 2023-12-16 NOTE — ED Provider Notes (Signed)
  Physical Exam  BP (!) 132/90   Pulse 74   Temp 98.7 F (37.1 C) (Axillary)   Resp 18   Ht 5' 7 (1.702 m)   Wt 95.3 kg   SpO2 100%   BMI 32.91 kg/m   Physical Exam HENT:     Head: Atraumatic.  Cardiovascular:     Rate and Rhythm: Normal rate and regular rhythm.  Pulmonary:     Effort: Pulmonary effort is normal.     Breath sounds: Normal breath sounds.     Procedures  Procedures  ED Course / MDM    Medical Decision Making Amount and/or Complexity of Data Reviewed Labs: ordered. Radiology: ordered.   Patient was received in signout from prior ED provider PA Darice Mclean.  Please see note for further details. Stable vitals signs. In short was seen for a fall yesterday at [facility] had reassuring repeat CT scan of head. He continues to endorse some headache and confusion and thus was sent here. CT head today show no acute findings. We are adding on UA to rule out infection. Plan to discharge home (+/-) Abx for UTI.   UA: UA does have red blood cells however suspect this is secondary from attempted Foley placement.  There are no leukocytes nitrites or bacteria.  Stable for discharge back to facility for OP follow up.          Maleki Hippe N, PA-C 12/16/23 2131    Randol Simmonds, MD 12/17/23 [phone]

## 2023-12-16 NOTE — ED Notes (Signed)
 Transported to CT

## 2023-12-16 NOTE — ED Notes (Signed)
Bed alarm placed under patient. 

## 2023-12-16 NOTE — ED Notes (Signed)
 Spoke with patient wife Benjamin Jackson [phone] current update given, wife would like to be called back for final disposition .

## 2023-12-16 NOTE — ED Notes (Signed)
 Called [facility] at [phone] and gave report to med tech Emelia Shaver. She confirmed address [address], [place], building says [employer]. Headed to memory care unit.   RN then called [facility] non-emergency line to request EMS transport.

## 2023-12-16 NOTE — ED Provider Notes (Signed)
 [facility]ERGENCY DEPARTMENT AT [facility] Provider Note   CSN: [id number] Arrival date & time: 12/16/23  1023     Patient presents with: Altered Mental Status   GIORDAN FORDHAM is a 89 y.o. male.   Patient sent to the emergency department for evaluation of confusion.  Patient had a fall yesterday at the facility where he resides and was sent to [facility] emergency department for evaluation.  Patient had a CT scan performed at the facility and there was concern that patient could have a small hemorrhage.  Neurosurgery was consulted and patient had a repeat CT scan at 6 hours which was thought to show calcification instead of hemorrhage.  Patient had an episode of shaking at the facility today and they became concerned.  Patient has not had any fever he has not had any chills patient has not had any nausea or vomiting.  Patient had laboratory evaluation this a.m. at [facility] which was normal.  Patient denies any complaints.  The history is provided by the patient. No language interpreter was used.  Altered Mental Status Presenting symptoms: confusion        Prior to Admission medications   Medication Sig Start Date End Date Taking? Authorizing Provider  acetaminophen  (TYLENOL ) 500 MG tablet Take 1,000 mg by mouth every 6 (six) hours as needed (pain).   Yes [provider]  albuterol  (VENTOLIN  HFA) 108 (90 Base) MCG/ACT inhaler Inhale 2 puffs into the lungs every 6 (six) hours as needed for wheezing.   Yes [provider]  aspirin  EC 81 MG tablet Take 81 mg by mouth daily.   Yes [provider]  atorvastatin  (LIPITOR) 10 MG tablet Take 10 mg by mouth daily.   Yes [provider]  Cyanocobalamin (VITAMIN B-12 CR) 1000 MCG TBCR Take 1,000 mcg by mouth daily.   Yes [provider]  DULoxetine  (CYMBALTA ) 30 MG capsule Take 30 mg by mouth daily.   Yes [provider]  hydrOXYzine  (ATARAX ) 25 MG tablet Take 12.5 mg by  mouth every 8 (eight) hours as needed (agitation).   Yes [provider]  lactulose  (CHRONULAC ) 10 GM/15ML solution Take 20 g by mouth daily as needed (constipation).   Yes [provider]  magnesium chloride (SLOWMAG MG MUSCLE/HEART) 64 MG TBEC SR tablet Take 1 tablet by mouth 2 (two) times daily.   Yes [provider]  metFORMIN (GLUCOPHAGE) 500 MG tablet Take 1,000 mg by mouth daily.   Yes [provider]  montelukast  (SINGULAIR ) 10 MG tablet Take 10 mg by mouth at bedtime.   Yes [provider]  OLANZapine  (ZYPREXA ) 5 MG tablet Take 5 mg by mouth 2 (two) times daily.   Yes [provider]  polyethylene glycol powder (GLYCOLAX /MIRALAX ) 17 GM/SCOOP powder Take 17 g by mouth 2 (two) times daily.   Yes [provider]  rivastigmine  (EXELON ) 9.5 mg/24hr Place 9.5 mg onto the skin daily.   Yes [provider]    Allergies: Xylocaine [lidocaine hcl], Roxicodone [oxycodone], Zestril [lisinopril], Zofran [ondansetron hcl], and Wellbutrin [bupropion]    Review of Systems  Psychiatric/Behavioral:  Positive for confusion.   All other systems reviewed and are negative.   Updated Vital Signs BP (!) 132/90   Pulse 74   Temp 98.7 F (37.1 C) (Axillary)   Resp 18   Ht 5' 7 (1.702 m)   Wt 95.3 kg   SpO2 100%   BMI 32.91 kg/m   Physical  Exam Vitals and nursing note reviewed.  Constitutional:      Appearance: He is well-developed.  HENT:     Head: Normocephalic.  Cardiovascular:     Rate and Rhythm: Normal rate.  Pulmonary:     Effort: Pulmonary effort is normal.  Abdominal:     General: Abdomen is flat. There is no distension.     Palpations: Abdomen is soft.  Genitourinary:    Comments: Patient has abnormal penile anatomy.  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     Cervical back: Normal range of motion.     Comments: Bruising left elbow, bruising right  shoulder,  Skin:    General: Skin is warm.  Neurological:     General: No focal deficit present.     Mental Status: He is alert. He is disoriented.  Psychiatric:        Mood and Affect: Mood normal.   RN reports unable to obtain ua due to anatomy,  I attempted without success.    (all labs ordered are listed, but only abnormal results are displayed) Labs Reviewed  URINALYSIS, ROUTINE W REFLEX MICROSCOPIC    EKG: EKG Interpretation Date/Time:  Saturday December 16 2023 10:31:25 EDT Ventricular Rate:  74 PR Interval:  182 QRS Duration:  114 QT Interval:  420 QTC Calculation: 466 R Axis:   -53  Text Interpretation: Sinus tachycardia Multiform ventricular premature complexes Incomplete RBBB and LAFB Abnormal R-wave progression, late transition Nonspecific T abnormalities, lateral leads Confirmed by Patsey Lot [phone]) on 12/16/2023 12:02:40 PM  Radiology: CT Head Wo Contrast Result Date: 12/16/2023 CLINICAL DATA:  89 year old male status post fall presenting on 12/15/2023 with possible small acute subarachnoid hemorrhage on motion degraded exam. Repeat Head CT 0020 hours today. Subsequent encounter. EXAM: CT HEAD WITHOUT CONTRAST TECHNIQUE: Contiguous axial images were obtained from the base of the skull through the vertex without intravenous contrast. RADIATION DOSE REDUCTION: This exam was performed according to the departmental dose-optimization program which includes automated exposure control, adjustment of the mA and/or kV according to patient size and/or use of iterative reconstruction technique. COMPARISON:  Head CT 0020 hours today, 1836 hours yesterday. FINDINGS: Brain: Stable cerebral volume. Anterior temporal lobe, mesial temporal lobe volume loss. No midline shift, ventriculomegaly, mass effect, evidence of mass lesion, intracranial hemorrhage or evidence of cortically based acute infarction. Confluent bilateral cerebral white matter hypodensity. Stable small dystrophic  calcifications in the left periatrial white matter. Incidental dural calcifications also. Vascular: Calcified atherosclerosis at the skull base. No suspicious intracranial vascular hyperdensity. Skull: Previous left orbital fracture repair. No acute osseous abnormality identified. Sinuses/Orbits: Visualized paranasal sinuses and mastoids are stable and well aerated. Other: No acute orbit or scalp soft tissue finding. IMPRESSION: 1. No acute intracranial abnormality. No evidence of acute intracranial hemorrhage. 2. Temporal lobe atrophy. Advanced cerebral white matter disease. Cerebral Atrophy (ICD10-G31.9). Electronically Signed   By: VEAR Hurst M.D.   On: 12/16/2023 11:57     Procedures   Medications Ordered in the ED - No data to display                                  Medical Decision Making Patient fell last p.m. and was evaluated at [facility] emergency department.  Patient had 2 CT scans due to concern for possible head injury.  Patient was  sent here by the facility where he resides due to some shaking.  Amount and/or Complexity of Data Reviewed Labs: ordered. Radiology: ordered.    Details: CT head shows no acute changes  Risk Risk Details: Patient has not had any shaking or unusual behavior while in the emergency department.  Patient does have dementia which is his baseline.  I will check a urinalysis to make sure that patient does not have a UTI that caused his fall. Patient's care turned over to [facility], PA-C, UA pending.        Final diagnoses:  Fall, initial encounter  Concussion without loss of consciousness, initial encounter    ED Discharge Orders     None          Lizanne Erker K, PA-C 12/16/23 1542    Patsey Lot, MD 12/17/23 [phone]

## 2023-12-16 NOTE — ED Notes (Signed)
 Wife Ivery Nanney [phone] would like an update asap

## 2023-12-16 NOTE — ED Notes (Signed)
 [facility] arrived for pt transport back to [facility]

## 2023-12-20 ENCOUNTER — Inpatient Hospital Stay (HOSPITAL_COMMUNITY)
Admission: EM | Admit: 2023-12-20 | Discharge: 2023-12-27 | DRG: 522 | Disposition: A | Discharge status: Home / Self Care | Source: Skilled Nursing Facility | Attending: Internal Medicine | Admitting: Internal Medicine

## 2023-12-20 ENCOUNTER — Inpatient Hospital Stay (HOSPITAL_COMMUNITY)

## 2023-12-20 ENCOUNTER — Encounter (HOSPITAL_COMMUNITY): Payer: Self-pay | Admitting: Internal Medicine

## 2023-12-20 ENCOUNTER — Emergency Department (HOSPITAL_COMMUNITY)

## 2023-12-20 ENCOUNTER — Other Ambulatory Visit: Payer: Self-pay

## 2023-12-20 DIAGNOSIS — E66811 Obesity, class 1: Secondary | ICD-10-CM | POA: Diagnosis present

## 2023-12-20 DIAGNOSIS — D509 Iron deficiency anemia, unspecified: Secondary | ICD-10-CM | POA: Diagnosis present

## 2023-12-20 DIAGNOSIS — J45909 Unspecified asthma, uncomplicated: Secondary | ICD-10-CM | POA: Diagnosis present

## 2023-12-20 DIAGNOSIS — Z794 Long term (current) use of insulin: Secondary | ICD-10-CM

## 2023-12-20 DIAGNOSIS — E559 Vitamin D deficiency, unspecified: Secondary | ICD-10-CM | POA: Diagnosis present

## 2023-12-20 DIAGNOSIS — D539 Nutritional anemia, unspecified: Secondary | ICD-10-CM | POA: Diagnosis present

## 2023-12-20 DIAGNOSIS — E119 Type 2 diabetes mellitus without complications: Secondary | ICD-10-CM | POA: Insufficient documentation

## 2023-12-20 DIAGNOSIS — Z6832 Body mass index (BMI) 32.0-32.9, adult: Secondary | ICD-10-CM | POA: Diagnosis not present

## 2023-12-20 DIAGNOSIS — E1165 Type 2 diabetes mellitus with hyperglycemia: Secondary | ICD-10-CM | POA: Diagnosis present

## 2023-12-20 DIAGNOSIS — F02A3 Dementia in other diseases classified elsewhere, mild, with mood disturbance: Secondary | ICD-10-CM | POA: Diagnosis present

## 2023-12-20 DIAGNOSIS — G4733 Obstructive sleep apnea (adult) (pediatric): Secondary | ICD-10-CM | POA: Diagnosis present

## 2023-12-20 DIAGNOSIS — E1142 Type 2 diabetes mellitus with diabetic polyneuropathy: Secondary | ICD-10-CM | POA: Diagnosis present

## 2023-12-20 DIAGNOSIS — Z7984 Long term (current) use of oral hypoglycemic drugs: Secondary | ICD-10-CM | POA: Diagnosis not present

## 2023-12-20 DIAGNOSIS — S72002A Fracture of unspecified part of neck of left femur, initial encounter for closed fracture: Secondary | ICD-10-CM | POA: Diagnosis present

## 2023-12-20 DIAGNOSIS — I2510 Atherosclerotic heart disease of native coronary artery without angina pectoris: Secondary | ICD-10-CM | POA: Diagnosis present

## 2023-12-20 DIAGNOSIS — E871 Hypo-osmolality and hyponatremia: Secondary | ICD-10-CM | POA: Diagnosis present

## 2023-12-20 DIAGNOSIS — Z66 Do not resuscitate: Secondary | ICD-10-CM | POA: Diagnosis present

## 2023-12-20 DIAGNOSIS — F32A Depression, unspecified: Secondary | ICD-10-CM | POA: Diagnosis present

## 2023-12-20 DIAGNOSIS — S72011A Unspecified intracapsular fracture of right femur, initial encounter for closed fracture: Secondary | ICD-10-CM | POA: Diagnosis present

## 2023-12-20 DIAGNOSIS — N1831 Chronic kidney disease, stage 3a: Secondary | ICD-10-CM | POA: Diagnosis present

## 2023-12-20 DIAGNOSIS — Z79899 Other long term (current) drug therapy: Secondary | ICD-10-CM

## 2023-12-20 DIAGNOSIS — Z888 Allergy status to other drugs, medicaments and biological substances status: Secondary | ICD-10-CM

## 2023-12-20 DIAGNOSIS — G301 Alzheimer's disease with late onset: Secondary | ICD-10-CM | POA: Diagnosis present

## 2023-12-20 DIAGNOSIS — S72001A Fracture of unspecified part of neck of right femur, initial encounter for closed fracture: Secondary | ICD-10-CM | POA: Diagnosis not present

## 2023-12-20 DIAGNOSIS — R1310 Dysphagia, unspecified: Secondary | ICD-10-CM | POA: Diagnosis present

## 2023-12-20 DIAGNOSIS — K219 Gastro-esophageal reflux disease without esophagitis: Secondary | ICD-10-CM | POA: Diagnosis present

## 2023-12-20 DIAGNOSIS — D62 Acute posthemorrhagic anemia: Secondary | ICD-10-CM | POA: Diagnosis not present

## 2023-12-20 DIAGNOSIS — M810 Age-related osteoporosis without current pathological fracture: Secondary | ICD-10-CM | POA: Diagnosis present

## 2023-12-20 DIAGNOSIS — N183 Chronic kidney disease, stage 3 unspecified: Secondary | ICD-10-CM | POA: Insufficient documentation

## 2023-12-20 DIAGNOSIS — W19XXXA Unspecified fall, initial encounter: Secondary | ICD-10-CM | POA: Diagnosis present

## 2023-12-20 DIAGNOSIS — E1122 Type 2 diabetes mellitus with diabetic chronic kidney disease: Secondary | ICD-10-CM | POA: Diagnosis present

## 2023-12-20 DIAGNOSIS — Z7982 Long term (current) use of aspirin: Secondary | ICD-10-CM

## 2023-12-20 DIAGNOSIS — I129 Hypertensive chronic kidney disease with stage 1 through stage 4 chronic kidney disease, or unspecified chronic kidney disease: Secondary | ICD-10-CM | POA: Diagnosis present

## 2023-12-20 DIAGNOSIS — E785 Hyperlipidemia, unspecified: Secondary | ICD-10-CM | POA: Diagnosis present

## 2023-12-20 DIAGNOSIS — I10 Essential (primary) hypertension: Secondary | ICD-10-CM | POA: Diagnosis present

## 2023-12-20 DIAGNOSIS — F0280 Dementia in other diseases classified elsewhere without behavioral disturbance: Secondary | ICD-10-CM | POA: Diagnosis not present

## 2023-12-20 LAB — BASIC METABOLIC PANEL WITH GFR
Anion gap: 11 (ref 5–15)
BUN: 36 mg/dL — ABNORMAL HIGH (ref 8–23)
CO2: 23 mmol/L (ref 22–32)
Calcium: 9 mg/dL (ref 8.9–10.3)
Chloride: 110 mmol/L (ref 98–111)
Creatinine, Ser: 1 mg/dL (ref 0.61–1.24)
GFR, Estimated: >60 mL/min (ref >60)
Glucose, Bld: 217 mg/dL — ABNORMAL HIGH (ref 70–99)
Potassium: 4.1 mmol/L (ref 3.5–5.1)
Sodium: 144 mmol/L (ref 135–145)

## 2023-12-20 LAB — CBC WITH DIFFERENTIAL/PLATELET
Abs Immature Granulocytes: 0.04 K/uL (ref 0.00–0.07)
Basophils Absolute: 0 K/uL (ref 0.0–0.1)
Basophils Relative: 0 %
Eosinophils Absolute: 0.2 K/uL (ref 0.0–0.5)
Eosinophils Relative: 2 %
HCT: 38 % — ABNORMAL LOW (ref 39.0–52.0)
Hemoglobin: 11.9 g/dL — ABNORMAL LOW (ref 13.0–17.0)
Immature Granulocytes: 1 %
Lymphocytes Relative: 15 %
Lymphs Abs: 1.2 K/uL (ref 0.7–4.0)
MCH: 35.1 pg — ABNORMAL HIGH (ref 26.0–34.0)
MCHC: 31.3 g/dL (ref 30.0–36.0)
MCV: 112.1 fL — ABNORMAL HIGH (ref 80.0–100.0)
Monocytes Absolute: 0.9 K/uL (ref 0.1–1.0)
Monocytes Relative: 11 %
Neutro Abs: 5.4 K/uL (ref 1.7–7.7)
Neutrophils Relative %: 71 %
Platelets: 158 K/uL (ref 150–400)
RBC: 3.39 MIL/uL — ABNORMAL LOW (ref 4.22–5.81)
RDW: 13.6 % (ref 11.5–15.5)
WBC: 7.6 K/uL (ref 4.0–10.5)
nRBC: 0 % (ref 0.0–0.2)

## 2023-12-20 LAB — SURGICAL PCR SCREEN
MRSA, PCR: NEGATIVE
Staphylococcus aureus: NEGATIVE

## 2023-12-20 MED ORDER — MUPIROCIN 2 % EX OINT
1.0000 | TOPICAL_OINTMENT | Freq: Two times a day (BID) | CUTANEOUS | Status: AC
  Administered 2023-12-20 – 2023-12-25 (×10): 1 via NASAL
  Filled 2023-12-20 (×2): qty 22

## 2023-12-20 MED ORDER — PROCHLORPERAZINE EDISYLATE 10 MG/2ML IJ SOLN: 10.0000 mg | Freq: Four times a day (QID) | INTRAMUSCULAR | Status: DC | PRN

## 2023-12-20 MED ORDER — HYDROCODONE-ACETAMINOPHEN 5-325 MG PO TABS: 1.0000 | ORAL_TABLET | Freq: Four times a day (QID) | ORAL | Status: DC | PRN

## 2023-12-20 MED ORDER — ACETAMINOPHEN 325 MG PO TABS: 650.0000 mg | ORAL_TABLET | Freq: Four times a day (QID) | ORAL | Status: DC | PRN

## 2023-12-20 MED ORDER — HYDROCODONE-ACETAMINOPHEN 5-325 MG PO TABS
0.5000 | ORAL_TABLET | ORAL | Status: DC | PRN
  Administered 2023-12-23 – 2023-12-24 (×2): 0.5 via ORAL
  Filled 2023-12-20 (×2): qty 1

## 2023-12-20 MED ORDER — ACETAMINOPHEN 650 MG RE SUPP: 650.0000 mg | Freq: Four times a day (QID) | RECTAL | Status: DC | PRN

## 2023-12-20 MED ORDER — NALOXONE HCL 0.4 MG/ML IJ SOLN: 0.4000 mg | INTRAMUSCULAR | Status: DC | PRN

## 2023-12-20 MED ORDER — HYDROMORPHONE HCL 1 MG/ML IJ SOLN
0.5000 mg | INTRAMUSCULAR | Status: DC | PRN
  Administered 2023-12-21 – 2023-12-27 (×11): 0.5 mg via INTRAVENOUS
  Filled 2023-12-20 (×11): qty 0.5

## 2023-12-20 NOTE — ED Notes (Signed)
 Pt moved up to 4 liters because NT reported drop in saturation to 69%

## 2023-12-20 NOTE — H&P (View-Only) (Signed)
 ORTHOPAEDIC CONSULTATION  REQUESTING PHYSICIAN: Celinda Alm Lot, MD  PCP:  Delilah Murray HERO., MD  Chief Complaint: Fall, Right hip pain  HPI: Benjamin Jackson is a 89 y.o. male. History obtained from chart. He has past medical history significant of type II DM, hypertension, depression, hyperlipidemia, right hydronephrosis, GERD, late onset Alzheimer's, mild asthma, class I obesity, obstructive sleep apnea, osteoporosis, peripheral neuropathy, stage III CKD, type 2 diabetes, vitamin D deficiency.   He presented to the ED from [facility] concerned for hip pain. Patient had a mechanical fall 5 days ago and was initially seen at [facility] ED and then [facility] ED with reassuring head CT and lab workup and was discharged back to the facility.   Patient's RN at [facility], [place], provides most hx as patient is only AAOx1. RN stating that patient has had pain with ambulating for at least 3 days. Patient is usually a 1 person assist, but has needed more assistance recently. Patient without any recent infectious symptoms such as fever, nausea, vomiting, diarrhea. EMS noting shortening and external rotation of right hip prior to arrival. X-rays showed a right femoral neck fracture. Orthopedics consulted.    Per chart patient on Aspirin  81mg  daily.   Past Medical History:  Diagnosis Date   Diabetes mellitus without complication (HCC)    Hypertension    Past Surgical History:  Procedure Laterality Date   COLON SURGERY     removed 36 inches   NEPHROSTOMY     nephrostomy reversal     Social History   Socioeconomic History   Marital status: Married    Spouse name: Not on file   Number of children: Not on file   Years of education: Not on file   Highest education level: Not on file  Occupational History   Not on file  Tobacco Use   Smoking status: Never   Smokeless tobacco: Not on file  Substance and Sexual Activity   Alcohol  use: Yes     Alcohol /week: 0.0 standard drinks of alcohol    Drug use: No   Sexual activity: Not on file  Other Topics Concern   Not on file  Social History Narrative   Not on file   Social Drivers of Health   Financial Resource Strain: Not on file  Food Insecurity: Low Risk  (12/15/2023)   Received from [facility]   Hunger Vital Sign    Within the past 12 months, you worried that your food would run out before you got money to buy more: Never true    Within the past 12 months, the food you bought just didn't last and you didn't have money to get more. : Never true  Transportation Needs: No Transportation Needs (12/15/2023)   Received from [employer]    In the past 12 months, has lack of reliable transportation kept you from medical appointments, meetings, work or from getting things needed for daily living? : No  Physical Activity: Not on file  Stress: Not on file  Social Connections: Unknown (10/11/2021)   Received from [facility]   Social Network    Social Network: Not on file   No family history on file. Allergies  Allergen Reactions   Xylocaine [Lidocaine Hcl] Anaphylaxis   Roxicodone [Oxycodone] Other (See Comments)    Dizziness  Confusion Hallucinations  Able to tolerate Norco   Zestril [Lisinopril] Other (See Comments)    Unknown reaction   Zofran [  Ondansetron Hcl] Dermatitis   Wellbutrin [Bupropion] Other (See Comments)    Unknown reaction   Prior to Admission medications   Medication Sig Start Date End Date Taking? Authorizing Provider  acetaminophen  (TYLENOL ) 500 MG tablet Take 1,000 mg by mouth every 6 (six) hours as needed (pain).    [provider]  albuterol  (VENTOLIN  HFA) 108 (90 Base) MCG/ACT inhaler Inhale 2 puffs into the lungs every 6 (six) hours as needed for wheezing.    [provider]  aspirin  EC 81 MG tablet Take 81 mg by mouth daily.    [provider]  atorvastatin  (LIPITOR) 10 MG tablet Take 10 mg by mouth  daily.    [provider]  Cyanocobalamin (VITAMIN B-12 CR) 1000 MCG TBCR Take 1,000 mcg by mouth daily.    [provider]  DULoxetine  (CYMBALTA ) 30 MG capsule Take 30 mg by mouth daily.    [provider]  hydrOXYzine  (ATARAX ) 25 MG tablet Take 12.5 mg by mouth every 8 (eight) hours as needed (agitation).    [provider]  lactulose  (CHRONULAC ) 10 GM/15ML solution Take 20 g by mouth daily as needed (constipation).    [provider]  magnesium chloride (SLOWMAG MG MUSCLE/HEART) 64 MG TBEC SR tablet Take 1 tablet by mouth 2 (two) times daily.    [provider]  metFORMIN (GLUCOPHAGE) 500 MG tablet Take 1,000 mg by mouth daily.    [provider]  montelukast  (SINGULAIR ) 10 MG tablet Take 10 mg by mouth at bedtime.    [provider]  OLANZapine  (ZYPREXA ) 5 MG tablet Take 5 mg by mouth 2 (two) times daily.    [provider]  polyethylene glycol powder (GLYCOLAX /MIRALAX ) 17 GM/SCOOP powder Take 17 g by mouth 2 (two) times daily.    [provider]  rivastigmine  (EXELON ) 9.5 mg/24hr Place 9.5 mg onto the skin daily.    [provider]   DG Chest Port 1 View Result Date: 12/20/2023 CLINICAL DATA:  801545 Pre-op cardiovascular exam 801545 Pre-op evaluation for known right hip fracture. EXAM: PORTABLE CHEST 1 VIEW COMPARISON:  Radiographs 06/16/2022 and 12/18/2018. FINDINGS: The heart size and mediastinal contours are stable with mild cardiac enlargement. The lungs appear clear. No evidence of pleural effusion or pneumothorax. Stable postsurgical changes from lower cervical fusion and left shoulder reverse arthroplasty. There are old left-sided rib fractures without evidence of acute osseous abnormality. IMPRESSION: No evidence of acute cardiopulmonary process. Stable mild cardiac enlargement. Electronically Signed   By: Elsie Perone M.D.   On: 12/20/2023 11:26   DG Hip Unilat W or Wo Pelvis 2-3 Views  Right Result Date: 12/20/2023 CLINICAL DATA:  Fall, right hip pain EXAM: DG HIP (WITH OR WITHOUT PELVIS) 2-3V RIGHT COMPARISON:  CT pelvis 09/11/2023 FINDINGS: Acute right femoral neck fracture with varus angulation. Lower lumbar spondylosis. Loss of intervertebral disc height at L5-S1. Mild spurring of both acetabula. IMPRESSION: 1. Acute right femoral neck fracture with varus angulation. 2. Lower lumbar spondylosis and degenerative disc disease. Electronically Signed   By: Ryan Salvage M.D.   On: 12/20/2023 10:18    Positive ROS: Unable to assess due to mentation.   Physical Exam: General: Patient is awake, oriented x0. Did not follow commands, does endorse some pain. Cardiovascular: No pedal edema Respiratory: No cyanosis, no use of accessory musculature GI: No organomegaly, abdomen is soft and non-tender Skin: No lesions in the area of chief complaint Neurologic: unable to fully assess.  Psychiatric: Patient is not  competent for consent.  Lymphatic: No axillary or cervical lymphadenopathy  MUSCULOSKELETAL:  Examination of the right hip reveals no skin wounds or lesions. Pain with hip movement and trochanteric tenderness to palpation. ER and shortening noted. Small abrasion over right knee with eschar.   Unable to fully assess sensation and motor function due to mentation, not following commands. He does endorse sensation on exam. Will not move extremities.  Distal pedal pulses 2+ bilaterally. Capillary refill <2 seconds. No significant pedal edema, venous stasis changes noted.   Assessment: Right femoral neck fracture.   Plan: He has an unstable right femoral neck fracture that will require surgical fixation to allow for pain control and immediate mobilization out of bed. TRH consulted for admission and perioperative medial optimization. Plan for right hip hemiarthroplasty tomorrow. Hold chemical DVT ppx. NPO after MN tonight. Will contact family to update and for consent due to  mentation.     Valery GORMAN Potters, PA-C    12/20/2023 2:29 PM

## 2023-12-20 NOTE — Consult Note (Signed)
 ORTHOPAEDIC CONSULTATION  REQUESTING PHYSICIAN: Celinda Alm Lot, MD  PCP:  Delilah Murray HERO., MD  Chief Complaint: Fall, Right hip pain  HPI: Benjamin Jackson is a 89 y.o. male. History obtained from chart. He has past medical history significant of type II DM, hypertension, depression, hyperlipidemia, right hydronephrosis, GERD, late onset Alzheimer's, mild asthma, class I obesity, obstructive sleep apnea, osteoporosis, peripheral neuropathy, stage III CKD, type 2 diabetes, vitamin D deficiency.   He presented to the ED from [facility] concerned for hip pain. Patient had a mechanical fall 5 days ago and was initially seen at [facility] ED and then [facility] ED with reassuring head CT and lab workup and was discharged back to the facility.   Patient's RN at [facility], [place], provides most hx as patient is only AAOx1. RN stating that patient has had pain with ambulating for at least 3 days. Patient is usually a 1 person assist, but has needed more assistance recently. Patient without any recent infectious symptoms such as fever, nausea, vomiting, diarrhea. EMS noting shortening and external rotation of right hip prior to arrival. X-rays showed a right femoral neck fracture. Orthopedics consulted.    Per chart patient on Aspirin  81mg  daily.   Past Medical History:  Diagnosis Date   Diabetes mellitus without complication (HCC)    Hypertension    Past Surgical History:  Procedure Laterality Date   COLON SURGERY     removed 36 inches   NEPHROSTOMY     nephrostomy reversal     Social History   Socioeconomic History   Marital status: Married    Spouse name: Not on file   Number of children: Not on file   Years of education: Not on file   Highest education level: Not on file  Occupational History   Not on file  Tobacco Use   Smoking status: Never   Smokeless tobacco: Not on file  Substance and Sexual Activity   Alcohol  use: Yes     Alcohol /week: 0.0 standard drinks of alcohol    Drug use: No   Sexual activity: Not on file  Other Topics Concern   Not on file  Social History Narrative   Not on file   Social Drivers of Health   Financial Resource Strain: Not on file  Food Insecurity: Low Risk  (12/15/2023)   Received from [facility]   Hunger Vital Sign    Within the past 12 months, you worried that your food would run out before you got money to buy more: Never true    Within the past 12 months, the food you bought just didn't last and you didn't have money to get more. : Never true  Transportation Needs: No Transportation Needs (12/15/2023)   Received from [employer]    In the past 12 months, has lack of reliable transportation kept you from medical appointments, meetings, work or from getting things needed for daily living? : No  Physical Activity: Not on file  Stress: Not on file  Social Connections: Unknown (10/11/2021)   Received from [facility]   Social Network    Social Network: Not on file   No family history on file. Allergies  Allergen Reactions   Xylocaine [Lidocaine Hcl] Anaphylaxis   Roxicodone [Oxycodone] Other (See Comments)    Dizziness  Confusion Hallucinations  Able to tolerate Norco   Zestril [Lisinopril] Other (See Comments)    Unknown reaction   Zofran [  Ondansetron Hcl] Dermatitis   Wellbutrin [Bupropion] Other (See Comments)    Unknown reaction   Prior to Admission medications   Medication Sig Start Date End Date Taking? Authorizing Provider  acetaminophen  (TYLENOL ) 500 MG tablet Take 1,000 mg by mouth every 6 (six) hours as needed (pain).    [provider]  albuterol  (VENTOLIN  HFA) 108 (90 Base) MCG/ACT inhaler Inhale 2 puffs into the lungs every 6 (six) hours as needed for wheezing.    [provider]  aspirin  EC 81 MG tablet Take 81 mg by mouth daily.    [provider]  atorvastatin  (LIPITOR) 10 MG tablet Take 10 mg by mouth  daily.    [provider]  Cyanocobalamin (VITAMIN B-12 CR) 1000 MCG TBCR Take 1,000 mcg by mouth daily.    [provider]  DULoxetine  (CYMBALTA ) 30 MG capsule Take 30 mg by mouth daily.    [provider]  hydrOXYzine  (ATARAX ) 25 MG tablet Take 12.5 mg by mouth every 8 (eight) hours as needed (agitation).    [provider]  lactulose  (CHRONULAC ) 10 GM/15ML solution Take 20 g by mouth daily as needed (constipation).    [provider]  magnesium chloride (SLOWMAG MG MUSCLE/HEART) 64 MG TBEC SR tablet Take 1 tablet by mouth 2 (two) times daily.    [provider]  metFORMIN (GLUCOPHAGE) 500 MG tablet Take 1,000 mg by mouth daily.    [provider]  montelukast  (SINGULAIR ) 10 MG tablet Take 10 mg by mouth at bedtime.    [provider]  OLANZapine  (ZYPREXA ) 5 MG tablet Take 5 mg by mouth 2 (two) times daily.    [provider]  polyethylene glycol powder (GLYCOLAX /MIRALAX ) 17 GM/SCOOP powder Take 17 g by mouth 2 (two) times daily.    [provider]  rivastigmine  (EXELON ) 9.5 mg/24hr Place 9.5 mg onto the skin daily.    [provider]   DG Chest Port 1 View Result Date: 12/20/2023 CLINICAL DATA:  [id number] Pre-op cardiovascular exam 801545 Pre-op evaluation for known right hip fracture. EXAM: PORTABLE CHEST 1 VIEW COMPARISON:  Radiographs 06/16/2022 and 12/18/2018. FINDINGS: The heart size and mediastinal contours are stable with mild cardiac enlargement. The lungs appear clear. No evidence of pleural effusion or pneumothorax. Stable postsurgical changes from lower cervical fusion and left shoulder reverse arthroplasty. There are old left-sided rib fractures without evidence of acute osseous abnormality. IMPRESSION: No evidence of acute cardiopulmonary process. Stable mild cardiac enlargement. Electronically Signed   By: Elsie Perone M.D.   On: 12/20/2023 11:26   DG Hip Unilat W or Wo Pelvis 2-3 Views  Right Result Date: 12/20/2023 CLINICAL DATA:  Fall, right hip pain EXAM: DG HIP (WITH OR WITHOUT PELVIS) 2-3V RIGHT COMPARISON:  CT pelvis 09/11/2023 FINDINGS: Acute right femoral neck fracture with varus angulation. Lower lumbar spondylosis. Loss of intervertebral disc height at L5-S1. Mild spurring of both acetabula. IMPRESSION: 1. Acute right femoral neck fracture with varus angulation. 2. Lower lumbar spondylosis and degenerative disc disease. Electronically Signed   By: Ryan Salvage M.D.   On: 12/20/2023 10:18    Positive ROS: Unable to assess due to mentation.   Physical Exam: General: Patient is awake, oriented x0. Did not follow commands, does endorse some pain. Cardiovascular: No pedal edema Respiratory: No cyanosis, no use of accessory musculature GI: No organomegaly, abdomen is soft and non-tender Skin: No lesions in the area of chief complaint Neurologic: unable to fully assess.  Psychiatric: Patient is not  competent for consent.  Lymphatic: No axillary or cervical lymphadenopathy  MUSCULOSKELETAL:  Examination of the right hip reveals no skin wounds or lesions. Pain with hip movement and trochanteric tenderness to palpation. ER and shortening noted. Small abrasion over right knee with eschar.   Unable to fully assess sensation and motor function due to mentation, not following commands. He does endorse sensation on exam. Will not move extremities.  Distal pedal pulses 2+ bilaterally. Capillary refill <2 seconds. No significant pedal edema, venous stasis changes noted.   Assessment: Right femoral neck fracture.   Plan: He has an unstable right femoral neck fracture that will require surgical fixation to allow for pain control and immediate mobilization out of bed. TRH consulted for admission and perioperative medial optimization. Plan for right hip hemiarthroplasty tomorrow. Hold chemical DVT ppx. NPO after MN tonight. Will contact family to update and for consent due to  mentation.     Valery GORMAN Potters, PA-C    12/20/2023 2:29 PM

## 2023-12-20 NOTE — H&P (Signed)
 History and Physical    Patient: Benjamin Jackson FMW:[id number] DOB: 1934-01-27 DOA: 12/20/2023 DOS: the patient was seen and examined on 12/20/2023 PCP: Delilah Murray HERO., MD  Patient coming from: Home  Chief Complaint:  Chief Complaint  Patient presents with   Fall   Hip Pain   HPI: Benjamin Jackson is a 89 y.o. male with medical history significant of type II DM, hypertension, depression, hyperlipidemia, right hydronephrosis, GERD, late onset Alzheimer's, mild asthma, class I obesity, obstructive sleep apnea, osteoporosis, peripheral neuropathy, stage III CKD, type 2 diabetes, vitamin D deficiency who had a mechanical fall at his facility injuring his right pelvic area and sustaining a closed left hip fracture.  He is currently sedated and unable to provide further information.  Lab work: CBC showed white count 7.6, hemoglobin 11.9 g/dL with an MCV of 887.8 fL and platelet 150.6.  BMP showed a glucose of 217 and BUN of 36 mg/dL, creatinine and electrolytes were normal  Imaging: Portable 1 view chest radiograph no evidence of acute cardiopulmonary process stable cardiomegaly.  Right hip x-ray showing acute right femoral neck fracture with valgus angulation.  Lower lumbar spondylosis and DDD.   ED course: Initial vital signs were temperature 97.8, pulse 68, respiration 9, BP 146/77 mmHg O2 sat 96%.  The patient was placed on oxygen in the emergency department.  Review of Systems: As mentioned in the history of present illness. All other systems reviewed and are negative. Past Medical History:  Diagnosis Date   Diabetes mellitus without complication (HCC)    Hypertension    Past Surgical History:  Procedure Laterality Date   COLON SURGERY     removed 36 inches   NEPHROSTOMY     nephrostomy reversal     Social History:  reports that he has never smoked. He does not have any smokeless tobacco history on file. He reports current alcohol  use. He reports that he does not use drugs.  Allergies   Allergen Reactions   Xylocaine [Lidocaine Hcl] Anaphylaxis   Roxicodone [Oxycodone] Other (See Comments)    Dizziness  Confusion Hallucinations  Able to tolerate Norco   Zestril [Lisinopril] Other (See Comments)    Unknown reaction   Zofran [Ondansetron Hcl] Dermatitis   Wellbutrin [Bupropion] Other (See Comments)    Unknown reaction    No family history on file.  Prior to Admission medications   Medication Sig Start Date End Date Taking? Authorizing Provider  acetaminophen  (TYLENOL ) 500 MG tablet Take 1,000 mg by mouth every 6 (six) hours as needed (pain).    [provider]  albuterol  (VENTOLIN  HFA) 108 (90 Base) MCG/ACT inhaler Inhale 2 puffs into the lungs every 6 (six) hours as needed for wheezing.    [provider]  aspirin  EC 81 MG tablet Take 81 mg by mouth daily.    [provider]  atorvastatin  (LIPITOR) 10 MG tablet Take 10 mg by mouth daily.    [provider]  Cyanocobalamin (VITAMIN B-12 CR) 1000 MCG TBCR Take 1,000 mcg by mouth daily.    [provider]  DULoxetine  (CYMBALTA ) 30 MG capsule Take 30 mg by mouth daily.    [provider]  hydrOXYzine  (ATARAX ) 25 MG tablet Take 12.5 mg by mouth every 8 (eight) hours as needed (agitation).    [provider]  lactulose  (CHRONULAC ) 10 GM/15ML solution Take 20 g by mouth daily as needed (constipation).    [provider]  magnesium chloride (SLOWMAG MG MUSCLE/HEART) 64 MG TBEC  SR tablet Take 1 tablet by mouth 2 (two) times daily.    [provider]  metFORMIN (GLUCOPHAGE) 500 MG tablet Take 1,000 mg by mouth daily.    [provider]  montelukast  (SINGULAIR ) 10 MG tablet Take 10 mg by mouth at bedtime.    [provider]  OLANZapine  (ZYPREXA ) 5 MG tablet Take 5 mg by mouth 2 (two) times daily.    [provider]  polyethylene glycol powder (GLYCOLAX /MIRALAX ) 17 GM/SCOOP powder Take 17 g by mouth 2 (two) times daily.     [provider]  rivastigmine  (EXELON ) 9.5 mg/24hr Place 9.5 mg onto the skin daily.    [provider]    Physical Exam: Vitals:   12/20/23 1001 12/20/23 1002  SpO2: 99%   Weight:  95.3 kg  Height:  5' 7 (1.702 m)   Physical Exam Vitals and nursing note reviewed.  Constitutional:      General: He is sleeping. He is not in acute distress.    Appearance: Normal appearance. He is ill-appearing.     Interventions: Nasal cannula in place.  HENT:     Head: Normocephalic.     Nose: No rhinorrhea.     Mouth/Throat:     Mouth: Mucous membranes are dry.  Eyes:     General: No scleral icterus.    Pupils: Pupils are equal, round, and reactive to light.  Neck:     Vascular: No JVD.  Cardiovascular:     Rate and Rhythm: Normal rate and regular rhythm.     Heart sounds: S1 normal and S2 normal.  Pulmonary:     Effort: Pulmonary effort is normal.     Breath sounds: Normal breath sounds. No wheezing, rhonchi or rales.  Abdominal:     General: Bowel sounds are normal. There is no distension.     Palpations: Abdomen is soft.     Tenderness: There is no abdominal tenderness. There is no right CVA tenderness or left CVA tenderness.  Musculoskeletal:     Cervical back: Neck supple.     Right lower leg: No edema.     Left lower leg: No edema.  Skin:    General: Skin is warm and dry.  Neurological:     General: No focal deficit present.     Comments: Sedated.  Psychiatric:        Mood and Affect: Mood normal.     Comments: Sedated.     Data Reviewed:  Results are pending, will review when available.  Assessment and Plan: Principal Problem:   Closed right hip fracture, initial encounter (HCC) Admit to telemetry/inpatient. Ice area as needed. Buck's traction per protocol. Analgesics as needed. Antiemetics as needed. Consult TOC team. Consult nutritional services. PT evaluation after surgery. Orthopedic surgery consulted by ED.  Active Problems:    Coronary artery disease Continue aspirin  and atorvastatin .    Essential hypertension Monitor blood pressure. Antihypertensives as needed.    Gastroesophageal reflux disease without esophagitis Continue PPI after [facility] conciliation done.    Hyperlipidemia Continue atorvastatin  pending med reconciliation.    Iron deficiency anemia Monitor hematocrit/hemoglobin.    Depressive disorder Continue regular meds pending med reconciliation.    Obstructive sleep apnea syndrome CPAP at bedtime.    Type 2 diabetes mellitus with hyperglycemia (HCC) Carbohydrate modified diet. Continue metformin pending med rec. CBG monitoring with RI SS. Check hemoglobin A1c.     Advance Care Planning:   Code Status: Limited: Do not attempt resuscitation (DNR) -DNR-LIMITED -Do  Not Intubate/DNI    Consults: Orthopedic surgery (Dr. Sharl).   Family Communication:   Severity of Illness: The appropriate patient status for this patient is INPATIENT. Inpatient status is judged to be reasonable and necessary in order to provide the required intensity of service to ensure the patient's safety. The patient's presenting symptoms, physical exam findings, and initial radiographic and laboratory data in the context of their chronic comorbidities is felt to place them at high risk for further clinical deterioration. Furthermore, it is not anticipated that the patient will be medically stable for discharge from the hospital within 2 midnights of admission.   * I certify that at the point of admission it is my clinical judgment that the patient will require inpatient hospital care spanning beyond 2 midnights from the point of admission due to high intensity of service, high risk for further deterioration and high frequency of surveillance required.*  Author: Alm Dorn Castor, MD 12/20/2023 11:08 AM  For on call review [email or web address].   This document was prepared using Dragon voice recognition software and may  contain some unintended transcription errors.

## 2023-12-20 NOTE — ED Notes (Signed)
 SABRA

## 2023-12-20 NOTE — ED Triage Notes (Signed)
 Pt bib [facility] from [facility]. Fall on Friday,on thinners and hit head. Had a scan, was reported normal (according to nursing facility). Shortening and rotation of right leg. AO x zero. EMS VSS CBG was 229 according to EMS

## 2023-12-20 NOTE — ED Provider Notes (Signed)
 [place] EMERGENCY DEPARTMENT AT [facility] Provider Note   CSN: [id number] Arrival date & time: 12/20/23  9055     Patient presents with: Fall and Hip Pain   Benjamin Jackson is a 89 y.o. male with PMHx DM, HTN, dementia who presents to ED from [facility] concerned for hip pain. Patient had a mechanical fall 5 days ago and was initially seen at [facility] ED and then [facility] ED with reassuring head CT and lab workup and was discharged back to the facility.  Patient's RN at [facility], [place], provides most hx as patient is only AAOx1. RN stating that patient has had pain with ambulating for at least 3 days. Patient is usually a 1 person assist, but has needed more assistance recently. Patient without any recent infectious symptoms such as fever, nausea, vomiting, diarrhea. EMS noting shortening and external rotation of right hip prior to arrival.    Fall  Hip Pain       Prior to Admission medications   Medication Sig Start Date End Date Taking? Authorizing Provider  acetaminophen  (TYLENOL ) 500 MG tablet Take 1,000 mg by mouth every 6 (six) hours as needed (pain).    [provider]  albuterol  (VENTOLIN  HFA) 108 (90 Base) MCG/ACT inhaler Inhale 2 puffs into the lungs every 6 (six) hours as needed for wheezing.    [provider]  aspirin  EC 81 MG tablet Take 81 mg by mouth daily.    [provider]  atorvastatin  (LIPITOR) 10 MG tablet Take 10 mg by mouth daily.    [provider]  Cyanocobalamin (VITAMIN B-12 CR) 1000 MCG TBCR Take 1,000 mcg by mouth daily.    [provider]  DULoxetine  (CYMBALTA ) 30 MG capsule Take 30 mg by mouth daily.    [provider]  hydrOXYzine  (ATARAX ) 25 MG tablet Take 12.5 mg by mouth every 8 (eight) hours as needed (agitation).    [provider]  lactulose  (CHRONULAC ) 10 GM/15ML solution Take 20 g by mouth daily as needed (constipation).     [provider]  magnesium chloride (SLOWMAG MG MUSCLE/HEART) 64 MG TBEC SR tablet Take 1 tablet by mouth 2 (two) times daily.    [provider]  metFORMIN (GLUCOPHAGE) 500 MG tablet Take 1,000 mg by mouth daily.    [provider]  montelukast  (SINGULAIR ) 10 MG tablet Take 10 mg by mouth at bedtime.    [provider]  OLANZapine  (ZYPREXA ) 5 MG tablet Take 5 mg by mouth 2 (two) times daily.    [provider]  polyethylene glycol powder (GLYCOLAX /MIRALAX ) 17 GM/SCOOP powder Take 17 g by mouth 2 (two) times daily.    [provider]  rivastigmine  (EXELON ) 9.5 mg/24hr Place 9.5 mg onto the skin daily.    [provider]    Allergies: Xylocaine [lidocaine hcl], Roxicodone [oxycodone], Zestril [lisinopril], Zofran [ondansetron hcl], and Wellbutrin [bupropion]    Review of Systems  Musculoskeletal:        Hip pain    Updated Vital Signs Ht 5' 7 (1.702 m)   Wt 95.3 kg   SpO2 99%   BMI 32.91 kg/m   Physical Exam Vitals and nursing note reviewed.  Constitutional:      General: He is not in acute distress.    Appearance: He is ill-appearing (chronically ill-appearing). He is not toxic-appearing.  HENT:     Head: Normocephalic and atraumatic.     Mouth/Throat:  Mouth: Mucous membranes are moist.  Eyes:     General: No scleral icterus.       Right eye: No discharge.        Left eye: No discharge.     Conjunctiva/sclera: Conjunctivae normal.  Cardiovascular:     Rate and Rhythm: Normal rate and regular rhythm.     Pulses: Normal pulses.     Heart sounds: Normal heart sounds. No murmur heard. Pulmonary:     Effort: Pulmonary effort is normal. No respiratory distress.     Breath sounds: Normal breath sounds. No wheezing, rhonchi or rales.  Abdominal:     General: Abdomen is flat. Bowel sounds are normal. There is no distension.     Palpations: Abdomen is soft. There is no mass.     Tenderness: There is no  abdominal tenderness.  Musculoskeletal:     Right lower leg: No edema.     Left lower leg: No edema.     Comments: Right leg shortened with external rotation. +2 pedal pulse. Right leg appears NV intact.   Skin:    General: Skin is warm and dry.     Findings: No rash.  Neurological:     General: No focal deficit present.     Mental Status: He is alert. Mental status is at baseline.     Comments: AAOx1. Pleasantly demented.  Psychiatric:        Mood and Affect: Mood normal.     (all labs ordered are listed, but only abnormal results are displayed) Labs Reviewed  CBC WITH DIFFERENTIAL/PLATELET  BASIC METABOLIC PANEL WITH GFR    EKG: None  Radiology: DG Hip Unilat W or Wo Pelvis 2-3 Views Right Result Date: 12/20/2023 CLINICAL DATA:  Fall, right hip pain EXAM: DG HIP (WITH OR WITHOUT PELVIS) 2-3V RIGHT COMPARISON:  CT pelvis 09/11/2023 FINDINGS: Acute right femoral neck fracture with varus angulation. Lower lumbar spondylosis. Loss of intervertebral disc height at L5-S1. Mild spurring of both acetabula. IMPRESSION: 1. Acute right femoral neck fracture with varus angulation. 2. Lower lumbar spondylosis and degenerative disc disease. Electronically Signed   By: Ryan Salvage M.D.   On: 12/20/2023 10:18     .Critical Care  Performed by: Hoy Nidia FALCON, PA-C Authorized by: Hoy Nidia FALCON, PA-C   Critical care provider statement:    Critical care time (minutes):  30   Critical care was necessary to treat or prevent imminent or life-threatening deterioration of the following conditions: hip fracture.   Critical care was time spent personally by me on the following activities:  Development of treatment plan with patient or surrogate, discussions with consultants, evaluation of patient's response to treatment, examination of patient, ordering and review of laboratory studies, ordering and review of radiographic studies, ordering and performing treatments and interventions,  pulse oximetry, re-evaluation of patient's condition and review of old charts   Care discussed with: admitting provider      Medications Ordered in the ED  HYDROmorphone  (DILAUDID ) injection 0.5 mg (has no administration in time range)  HYDROcodone -acetaminophen  (NORCO/VICODIN) 5-325 MG per tablet 1 tablet (has no administration in time range)  acetaminophen  (TYLENOL ) tablet 650 mg (has no administration in time range)    Or  acetaminophen  (TYLENOL ) suppository 650 mg (has no administration in time range)  prochlorperazine  (COMPAZINE ) injection 10 mg (has no administration in time range)  Medical Decision Making Amount and/or Complexity of Data Reviewed Radiology: ordered.   This patient presents to the ED for concern of hip pain, this involves an extensive number of treatment options, and is a complaint that carries with it a high risk of complications and morbidity.  The differential diagnosis includes hemarthrosis, gout, septic joint, fracture, tendonitis, muscle strain, bursitis, compartment syndrome   Co morbidities that complicate the patient evaluation  DM, HTN, dementia   Additional history obtained:  7/19 CT head: no acute process 7/18 CBC: mild anemia at 13.2. no leukocytosis 7/18 CMP: mild hyponatremia at 135. ALP mildly elevated at 125.   Problem List / ED Course / Critical interventions / Medication management  Patient presents to ED concern for right hip pain a couple days after a fall at his nursing facility.  Physical exam with a shortened and externally rotated right hip.  Rest of physical exam reassuring.  Leg appears NV intact. Patient pleasantly demented and resting in bed in no acute distress. I Ordered, and personally interpreted labs.  These results are pending. Patient did have labs drawn 5 days ago which were reassuring.  I ordered imaging studies including hip x-ray. I independently visualized and interpreted imaging.  I agree with the radiologist interpretation of acute hip fracture. Shared results with patient. I requested consultation with the Orthopedic on-call Dr. Sharl,  and discussed lab and imaging findings as well as pertinent plan - they recommend: Hospitalist admission for hip fracture.  N.p.o. at midnight. I have reviewed the patients home medicines and have made adjustments as needed.   Social Determinants of Health:  Geriatric; memory care resident      Final diagnoses:  Closed fracture of neck of right femur, initial encounter [facility])    ED Discharge Orders     None          Hoy Nidia FALCON, [place] 12/20/23 1118    Tegeler, Lonni PARAS, MD 12/20/23 1544

## 2023-12-21 ENCOUNTER — Other Ambulatory Visit: Payer: Self-pay

## 2023-12-21 ENCOUNTER — Inpatient Hospital Stay (HOSPITAL_COMMUNITY): Admitting: Anesthesiology

## 2023-12-21 ENCOUNTER — Inpatient Hospital Stay (HOSPITAL_COMMUNITY)

## 2023-12-21 ENCOUNTER — Encounter (HOSPITAL_COMMUNITY)
Admission: EM | Disposition: A | Discharge status: Home / Self Care | Payer: Self-pay | Source: Skilled Nursing Facility | Attending: Internal Medicine

## 2023-12-21 DIAGNOSIS — I2510 Atherosclerotic heart disease of native coronary artery without angina pectoris: Secondary | ICD-10-CM | POA: Diagnosis not present

## 2023-12-21 DIAGNOSIS — I129 Hypertensive chronic kidney disease with stage 1 through stage 4 chronic kidney disease, or unspecified chronic kidney disease: Secondary | ICD-10-CM

## 2023-12-21 DIAGNOSIS — S72001A Fracture of unspecified part of neck of right femur, initial encounter for closed fracture: Secondary | ICD-10-CM

## 2023-12-21 DIAGNOSIS — N183 Chronic kidney disease, stage 3 unspecified: Secondary | ICD-10-CM

## 2023-12-21 HISTORY — PX: ANTERIOR APPROACH HEMI HIP ARTHROPLASTY: SHX6690

## 2023-12-21 LAB — COMPREHENSIVE METABOLIC PANEL WITH GFR
ALT: 109 U/L — ABNORMAL HIGH (ref 0–44)
AST: 117 U/L — ABNORMAL HIGH (ref 15–41)
Albumin: 2.6 g/dL — ABNORMAL LOW (ref 3.5–5.0)
Alkaline Phosphatase: 151 U/L — ABNORMAL HIGH (ref 38–126)
Anion gap: 7 (ref 5–15)
BUN: 32 mg/dL — ABNORMAL HIGH (ref 8–23)
CO2: 22 mmol/L (ref 22–32)
Calcium: 8.3 mg/dL — ABNORMAL LOW (ref 8.9–10.3)
Chloride: 111 mmol/L (ref 98–111)
Creatinine, Ser: 0.91 mg/dL (ref 0.61–1.24)
GFR, Estimated: >60 mL/min (ref >60)
Glucose, Bld: 184 mg/dL — ABNORMAL HIGH (ref 70–99)
Potassium: 4.4 mmol/L (ref 3.5–5.1)
Sodium: 140 mmol/L (ref 135–145)
Total Bilirubin: 1 mg/dL (ref 0.0–1.2)
Total Protein: 6.5 g/dL (ref 6.5–8.1)

## 2023-12-21 LAB — CBC
HCT: 35.1 % — ABNORMAL LOW (ref 39.0–52.0)
Hemoglobin: 11 g/dL — ABNORMAL LOW (ref 13.0–17.0)
MCH: 36.1 pg — ABNORMAL HIGH (ref 26.0–34.0)
MCHC: 31.3 g/dL (ref 30.0–36.0)
MCV: 115.1 fL — ABNORMAL HIGH (ref 80.0–100.0)
Platelets: 144 K/uL — ABNORMAL LOW (ref 150–400)
RBC: 3.05 MIL/uL — ABNORMAL LOW (ref 4.22–5.81)
RDW: 13.4 % (ref 11.5–15.5)
WBC: 7.1 K/uL (ref 4.0–10.5)
nRBC: 0 % (ref 0.0–0.2)

## 2023-12-21 LAB — GLUCOSE, CAPILLARY
Glucose-Capillary: 156 mg/dL — ABNORMAL HIGH (ref 70–99)
Glucose-Capillary: 162 mg/dL — ABNORMAL HIGH (ref 70–99)

## 2023-12-21 SURGERY — HEMIARTHROPLASTY, HIP, DIRECT ANTERIOR APPROACH, FOR FRACTURE
Anesthesia: Spinal | Site: Hip | Laterality: Right

## 2023-12-21 MED ORDER — SENNA 8.6 MG PO TABS
1.0000 | ORAL_TABLET | Freq: Two times a day (BID) | ORAL | Status: DC
  Administered 2023-12-21 – 2023-12-27 (×12): 8.6 mg via ORAL
  Filled 2023-12-21 (×12): qty 1

## 2023-12-21 MED ORDER — PROPOFOL 500 MG/50ML IV EMUL
INTRAVENOUS | Status: DC | PRN
  Administered 2023-12-21: 50 ug/kg/min via INTRAVENOUS

## 2023-12-21 MED ORDER — POVIDONE-IODINE 10 % EX SWAB: 2.0000 | Freq: Once | CUTANEOUS | Status: DC

## 2023-12-21 MED ORDER — CEFAZOLIN SODIUM-DEXTROSE 2-4 GM/100ML-% IV SOLN
2.0000 g | INTRAVENOUS | Status: AC
  Administered 2023-12-21: 2 g via INTRAVENOUS
  Filled 2023-12-21: qty 100

## 2023-12-21 MED ORDER — OLANZAPINE 5 MG PO TABS
5.0000 mg | ORAL_TABLET | Freq: Two times a day (BID) | ORAL | Status: DC
  Administered 2023-12-21 – 2023-12-27 (×11): 5 mg via ORAL
  Filled 2023-12-21 (×12): qty 1

## 2023-12-21 MED ORDER — BUPIVACAINE IN DEXTROSE 0.75-8.25 % IT SOLN
INTRATHECAL | Status: DC | PRN
Start: 2023-12-21 — End: 2023-12-21
  Administered 2023-12-21: 2 mL via INTRATHECAL

## 2023-12-21 MED ORDER — ASPIRIN 325 MG PO TBEC
325.0000 mg | DELAYED_RELEASE_TABLET | Freq: Every day | ORAL | Status: DC
  Administered 2023-12-22 – 2023-12-27 (×6): 325 mg via ORAL
  Filled 2023-12-21 (×6): qty 1

## 2023-12-21 MED ORDER — FENTANYL CITRATE PF 50 MCG/ML IJ SOSY: 25.0000 ug | PREFILLED_SYRINGE | INTRAMUSCULAR | Status: DC | PRN

## 2023-12-21 MED ORDER — METHOCARBAMOL 500 MG PO TABS: 500.0000 mg | ORAL_TABLET | Freq: Four times a day (QID) | ORAL | Status: DC | PRN

## 2023-12-21 MED ORDER — SODIUM CHLORIDE (PF) 0.9 % IJ SOLN
INTRAMUSCULAR | Status: AC
  Filled 2023-12-21: qty 30

## 2023-12-21 MED ORDER — WATER FOR IRRIGATION, STERILE IR SOLN
Status: DC | PRN
  Administered 2023-12-21: 2000 mL

## 2023-12-21 MED ORDER — KETOROLAC TROMETHAMINE 30 MG/ML IJ SOLN
INTRAMUSCULAR | Status: AC
  Filled 2023-12-21: qty 1

## 2023-12-21 MED ORDER — HYDRALAZINE HCL 20 MG/ML IJ SOLN: 10.0000 mg | Freq: Four times a day (QID) | INTRAMUSCULAR | Status: DC | PRN

## 2023-12-21 MED ORDER — BUPIVACAINE-EPINEPHRINE 0.25% -1:200000 IJ SOLN
INTRAMUSCULAR | Status: DC | PRN
  Administered 2023-12-21: 30 mL

## 2023-12-21 MED ORDER — VITAMIN B-12 1000 MCG PO TABS
1000.0000 ug | ORAL_TABLET | Freq: Every day | ORAL | Status: DC
  Administered 2023-12-23 – 2023-12-27 (×5): 1000 ug via ORAL
  Filled 2023-12-21 (×5): qty 1

## 2023-12-21 MED ORDER — MENTHOL 3 MG MT LOZG: 1.0000 | LOZENGE | OROMUCOSAL | Status: DC | PRN

## 2023-12-21 MED ORDER — SODIUM CHLORIDE 0.9 % IR SOLN
Status: DC | PRN
  Administered 2023-12-21: 1000 mL

## 2023-12-21 MED ORDER — PANTOPRAZOLE SODIUM 40 MG PO TBEC
40.0000 mg | DELAYED_RELEASE_TABLET | Freq: Every day | ORAL | Status: DC
  Administered 2023-12-24 – 2023-12-27 (×4): 40 mg via ORAL
  Filled 2023-12-21 (×5): qty 1

## 2023-12-21 MED ORDER — HYDROCODONE-ACETAMINOPHEN 5-325 MG PO TABS: 1.0000 | ORAL_TABLET | ORAL | 0 refills | Status: DC | PRN

## 2023-12-21 MED ORDER — POVIDONE-IODINE 10 % EX SWAB
2.0000 | Freq: Once | CUTANEOUS | Status: AC
  Administered 2023-12-21: 2 via TOPICAL

## 2023-12-21 MED ORDER — FENTANYL CITRATE (PF) 250 MCG/5ML IJ SOLN
INTRAMUSCULAR | Status: DC | PRN
  Administered 2023-12-21: 50 ug via INTRAVENOUS

## 2023-12-21 MED ORDER — LACTULOSE 10 GM/15ML PO SOLN: 20.0000 g | Freq: Every day | ORAL | Status: DC | PRN

## 2023-12-21 MED ORDER — MONTELUKAST SODIUM 10 MG PO TABS
10.0000 mg | ORAL_TABLET | Freq: Every day | ORAL | Status: DC
  Administered 2023-12-21 – 2023-12-26 (×6): 10 mg via ORAL
  Filled 2023-12-21 (×6): qty 1

## 2023-12-21 MED ORDER — DOCUSATE SODIUM 100 MG PO CAPS
100.0000 mg | ORAL_CAPSULE | Freq: Two times a day (BID) | ORAL | Status: DC
  Administered 2023-12-22 – 2023-12-27 (×6): 100 mg via ORAL
  Filled 2023-12-21 (×10): qty 1

## 2023-12-21 MED ORDER — ENSURE PLUS HIGH PROTEIN PO LIQD
237.0000 mL | Freq: Two times a day (BID) | ORAL | Status: DC
  Administered 2023-12-24 – 2023-12-27 (×5): 237 mL via ORAL

## 2023-12-21 MED ORDER — SODIUM CHLORIDE 0.9 % IV SOLN: INTRAVENOUS | Status: AC

## 2023-12-21 MED ORDER — FENTANYL CITRATE (PF) 100 MCG/2ML IJ SOLN
INTRAMUSCULAR | Status: AC
Start: 2023-12-21 — End: 2023-12-21
  Filled 2023-12-21: qty 2

## 2023-12-21 MED ORDER — METOCLOPRAMIDE HCL 5 MG/ML IJ SOLN: 5.0000 mg | Freq: Three times a day (TID) | INTRAMUSCULAR | Status: DC | PRN

## 2023-12-21 MED ORDER — ATORVASTATIN CALCIUM 10 MG PO TABS
10.0000 mg | ORAL_TABLET | Freq: Every day | ORAL | Status: DC
  Administered 2023-12-23 – 2023-12-27 (×5): 10 mg via ORAL
  Filled 2023-12-21 (×5): qty 1

## 2023-12-21 MED ORDER — PHENYLEPHRINE HCL-NACL 20-0.9 MG/250ML-% IV SOLN
INTRAVENOUS | Status: DC | PRN
  Administered 2023-12-21: 25 ug/min via INTRAVENOUS

## 2023-12-21 MED ORDER — METHOCARBAMOL 1000 MG/10ML IJ SOLN: 500.0000 mg | Freq: Four times a day (QID) | INTRAMUSCULAR | Status: DC | PRN

## 2023-12-21 MED ORDER — ALBUTEROL SULFATE (2.5 MG/3ML) 0.083% IN NEBU: 2.5000 mg | INHALATION_SOLUTION | Freq: Four times a day (QID) | RESPIRATORY_TRACT | Status: DC | PRN

## 2023-12-21 MED ORDER — BUPIVACAINE-EPINEPHRINE (PF) 0.25% -1:200000 IJ SOLN
INTRAMUSCULAR | Status: AC
  Filled 2023-12-21: qty 30

## 2023-12-21 MED ORDER — HYDROXYZINE HCL 25 MG PO TABS
12.5000 mg | ORAL_TABLET | Freq: Three times a day (TID) | ORAL | Status: DC | PRN
Start: 2023-12-21 — End: 2023-12-27

## 2023-12-21 MED ORDER — RIVASTIGMINE 9.5 MG/24HR TD PT24
9.5000 mg | MEDICATED_PATCH | Freq: Every morning | TRANSDERMAL | Status: DC
  Administered 2023-12-21 – 2023-12-27 (×7): 9.5 mg via TRANSDERMAL
  Filled 2023-12-21 (×7): qty 1

## 2023-12-21 MED ORDER — POLYETHYLENE GLYCOL 3350 17 GM/SCOOP PO POWD
17.0000 g | Freq: Two times a day (BID) | ORAL | Status: DC
  Filled 2023-12-21: qty 119

## 2023-12-21 MED ORDER — ISOPROPYL ALCOHOL 70 % SOLN
Status: DC | PRN
  Administered 2023-12-21: 1 via TOPICAL

## 2023-12-21 MED ORDER — CEFAZOLIN SODIUM-DEXTROSE 2-4 GM/100ML-% IV SOLN
2.0000 g | Freq: Four times a day (QID) | INTRAVENOUS | Status: AC
  Administered 2023-12-21 – 2023-12-22 (×2): 2 g via INTRAVENOUS
  Filled 2023-12-21 (×3): qty 100

## 2023-12-21 MED ORDER — PHENOL 1.4 % MT LIQD: 1.0000 | OROMUCOSAL | Status: DC | PRN

## 2023-12-21 MED ORDER — PROPOFOL 10 MG/ML IV BOLUS
INTRAVENOUS | Status: DC | PRN
  Administered 2023-12-21 (×3): 20 mg via INTRAVENOUS

## 2023-12-21 MED ORDER — DULOXETINE HCL 30 MG PO CPEP
30.0000 mg | ORAL_CAPSULE | Freq: Every day | ORAL | Status: DC
  Administered 2023-12-22 – 2023-12-27 (×6): 30 mg via ORAL
  Filled 2023-12-21 (×6): qty 1

## 2023-12-21 MED ORDER — SODIUM CHLORIDE (PF) 0.9 % IJ SOLN
INTRAMUSCULAR | Status: DC | PRN
  Administered 2023-12-21: 30 mL

## 2023-12-21 MED ORDER — TRANEXAMIC ACID-NACL 1000-0.7 MG/100ML-% IV SOLN
1000.0000 mg | INTRAVENOUS | Status: AC
  Administered 2023-12-21: 1000 mg via INTRAVENOUS
  Filled 2023-12-21: qty 100

## 2023-12-21 MED ORDER — ASPIRIN 81 MG PO CHEW: 81.0000 mg | CHEWABLE_TABLET | Freq: Two times a day (BID) | ORAL | 0 refills | Status: DC

## 2023-12-21 MED ORDER — CHLORHEXIDINE GLUCONATE 4 % EX SOLN
60.0000 mL | Freq: Once | CUTANEOUS | Status: DC
  Filled 2023-12-21: qty 60

## 2023-12-21 MED ORDER — KETOROLAC TROMETHAMINE 30 MG/ML IJ SOLN
INTRAMUSCULAR | Status: DC | PRN
  Administered 2023-12-21: 30 mg

## 2023-12-21 MED ORDER — ACETAMINOPHEN 10 MG/ML IV SOLN: 1000.0000 mg | Freq: Once | INTRAVENOUS | Status: DC | PRN

## 2023-12-21 MED ORDER — LACTATED RINGERS IV SOLN: INTRAVENOUS | Status: DC | PRN

## 2023-12-21 MED ORDER — METOCLOPRAMIDE HCL 10 MG PO TABS: 5.0000 mg | ORAL_TABLET | Freq: Three times a day (TID) | ORAL | Status: DC | PRN

## 2023-12-21 MED ORDER — ALBUMIN HUMAN 5 % IV SOLN: INTRAVENOUS | Status: DC | PRN

## 2023-12-21 SURGICAL SUPPLY — 48 items
BAG COUNTER SPONGE SURGICOUNT (BAG) IMPLANT
BAG ZIPLOCK 12X15 (MISCELLANEOUS) IMPLANT
BLADE SAW RECIPROCATING 77.5 (BLADE) ×1 IMPLANT
CATH FOLEY 2WAY SLVR 5CC 12FR (CATHETERS) IMPLANT
CHLORAPREP W/TINT 26 (MISCELLANEOUS) ×1 IMPLANT
COVER PERINEAL POST (MISCELLANEOUS) ×1 IMPLANT
COVER SURGICAL LIGHT HANDLE (MISCELLANEOUS) ×1 IMPLANT
DERMABOND ADVANCED .7 DNX12 (GAUZE/BANDAGES/DRESSINGS) ×2 IMPLANT
DRAPE IMP U-DRAPE 54X76 (DRAPES) ×1 IMPLANT
DRAPE SHEET LG 3/4 BI-LAMINATE (DRAPES) ×3 IMPLANT
DRAPE STERI IOBAN 125X83 (DRAPES) ×1 IMPLANT
DRAPE U-SHAPE 47X51 STRL (DRAPES) ×2 IMPLANT
DRSG AQUACEL AG ADV 3.5X10 (GAUZE/BANDAGES/DRESSINGS) ×1 IMPLANT
ELECT REM PT RETURN 15FT ADLT (MISCELLANEOUS) ×1 IMPLANT
GAUZE SPONGE 4X4 12PLY STRL (GAUZE/BANDAGES/DRESSINGS) ×1 IMPLANT
GLOVE BIO SURGEON STRL SZ7 (GLOVE) ×1 IMPLANT
GLOVE BIO SURGEON STRL SZ8.5 (GLOVE) ×2 IMPLANT
GLOVE BIOGEL PI IND STRL 7.5 (GLOVE) ×1 IMPLANT
GLOVE BIOGEL PI IND STRL 8.5 (GLOVE) ×1 IMPLANT
GOWN SPEC L3 XXLG W/TWL (GOWN DISPOSABLE) ×1 IMPLANT
GOWN STRL REUS W/ TWL XL LVL3 (GOWN DISPOSABLE) ×1 IMPLANT
HEAD MOD COCR 28MM HD -3MM NK (Orthopedic Implant) IMPLANT
HOLDER FOLEY CATH W/STRAP (MISCELLANEOUS) ×1 IMPLANT
HOOD PEEL AWAY T7 (MISCELLANEOUS) ×3 IMPLANT
KIT TURNOVER KIT A (KITS) ×1 IMPLANT
MANIFOLD NEPTUNE II (INSTRUMENTS) ×1 IMPLANT
MARKER SKIN DUAL TIP RULER LAB (MISCELLANEOUS) ×1 IMPLANT
NDL SAFETY ECLIPSE 18X1.5 (NEEDLE) ×1 IMPLANT
NDL SPNL 18GX3.5 QUINCKE PK (NEEDLE) ×1 IMPLANT
NEEDLE SPNL 18GX3.5 QUINCKE PK (NEEDLE) ×1 IMPLANT
PACK ANTERIOR HIP CUSTOM (KITS) ×1 IMPLANT
PENCIL SMOKE EVACUATOR (MISCELLANEOUS) ×1 IMPLANT
SEALER BIPOLAR AQUA 6.0 (INSTRUMENTS) ×1 IMPLANT
SET HNDPC FAN SPRY TIP SCT (DISPOSABLE) ×1 IMPLANT
SHELL RINGBLOC BI POLR 28X51MM (Orthopedic Implant) IMPLANT
SOLUTION PRONTOSAN WOUND 350ML (IRRIGATION / IRRIGATOR) ×1 IMPLANT
SPIKE FLUID TRANSFER (MISCELLANEOUS) ×1 IMPLANT
STAPLER SKIN PROX 35W (STAPLE) IMPLANT
STEM FEM CMTLS 16X152 133D (Stem) IMPLANT
SUT MNCRL AB 3-0 PS2 18 (SUTURE) ×1 IMPLANT
SUT MON AB 2-0 CT1 36 (SUTURE) ×1 IMPLANT
SUT STRATAFIX 14 PDO 48 VLT (SUTURE) ×1 IMPLANT
SUT VIC AB 2-0 CT1 TAPERPNT 27 (SUTURE) IMPLANT
SYR 3ML LL SCALE MARK (SYRINGE) ×1 IMPLANT
TOWEL GREEN STERILE FF (TOWEL DISPOSABLE) ×1 IMPLANT
TRAY FOLEY MTR SLVR 16FR STAT (SET/KITS/TRAYS/PACK) IMPLANT
TUBE SUCTION HIGH CAP CLEAR NV (SUCTIONS) ×1 IMPLANT
WATER STERILE IRR 1000ML POUR (IV SOLUTION) ×1 IMPLANT

## 2023-12-21 NOTE — Plan of Care (Signed)

## 2023-12-21 NOTE — Discharge Instructions (Signed)

## 2023-12-21 NOTE — Hospital Course (Addendum)
 HPI: Benjamin Jackson is a 89 y.o. male with medical history significant of type II DM, hypertension, depression, hyperlipidemia, right hydronephrosis, GERD, late onset Alzheimer's, mild asthma, class I obesity, obstructive sleep apnea, osteoporosis, peripheral neuropathy, stage III CKD, type 2 diabetes, vitamin D deficiency who had a mechanical fall at his facility injuring his right pelvic area and sustaining a closed right hip fracture.  He is currently sedated and unable to provide further information.   Significant Events: Admitted 12/20/2023 for right hip fracture 12-21-2023 right hip hemiarthroplasty  Admission Labs: white count 7.6, hemoglobin 11.9 g/dL with an MCV of 887.8 fL and platelet 150.6. BMP showed a glucose of 217 and BUN of 36 mg/dL, creatinine and electrolytes were normal   Admission Imaging Studies: Portable 1 view chest radiograph no evidence of acute cardiopulmonary process stable cardiomegaly.  Right hip x-ray showing acute right femoral neck fracture with valgus angulation. Lower lumbar spondylosis and DDD.   Significant Labs: A1C 7.0%  Significant Imaging Studies:   Antibiotic Therapy: Anti-infectives (From admission, onward)    Start     Dose/Rate Route Frequency Ordered Stop   12/21/23 2200  ceFAZolin  (ANCEF ) IVPB 2g/100 mL premix        2 g 200 mL/hr over 30 Minutes Intravenous Every 6 hours 12/21/23 2012 12/22/23 0429   12/21/23 1400  ceFAZolin  (ANCEF ) IVPB 2g/100 mL premix        2 g 200 mL/hr over 30 Minutes Intravenous On call to O.R. 12/21/23 1009 12/21/23 1558       Procedures: 12-21-2023 right hip hemiarthroplasty  Consultants: Orthopedics

## 2023-12-21 NOTE — Anesthesia Preprocedure Evaluation (Signed)
 Anesthesia Evaluation  Patient identified by MRN, date of birth, ID band Patient confused  General Assessment Comment:  Patient confused, asking inappropriate questions, not answering appropriately to my questions.  Reviewed: Allergy & Precautions, NPO status , Patient's Chart, lab work & pertinent test results, Unable to perform ROS - Chart review only  History of Anesthesia Complications Negative for: history of anesthetic complications  Airway Mallampati: Unable to assess  TM Distance: >3 FB Neck ROM: Full   Comment: Unable to assess due to compliance issues with dementia Dental  (+) Edentulous Upper, Edentulous Lower   Pulmonary asthma , sleep apnea , neg COPD, Patient abstained from smoking.Not current smoker   Pulmonary exam normal breath sounds clear to auscultation       Cardiovascular Exercise Tolerance: Poor METShypertension, Pt. on medications + CAD and + Cardiac Stents  (-) Past MI (-) dysrhythmias  Rhythm:Regular Rate:Normal - Systolic murmurs Saw cardiologist in office last month, doing well. Per note: DES to the LAD in 2017. TTE in 2017 with LVEF of 60%. NMST in 2020 with no reversible ischemia or infarction. Last TTE in 2023 w/ LVEF 60-65%, no wall motion abnormalities and no significant valvular disease. Doing well from a cardiac standpoint, no chest pain. On Aspirin , Atorvastatin . Metoprolol stopped last year due to low BP.   Neuro/Psych  PSYCHIATRIC DISORDERS  Depression   Dementia negative neurological ROS     GI/Hepatic ,GERD  Controlled,,(+)     (-) substance abuse    Endo/Other  diabetes    Renal/GU CRFRenal disease     Musculoskeletal   Abdominal   Peds  Hematology Denies blood thinner use or bleeding disorders.    Anesthesia Other Findings Past Medical History: No date: Diabetes mellitus without complication (HCC) No date: Hypertension  Reproductive/Obstetrics                               Anesthesia Physical Anesthesia Plan  ASA: 3  Anesthesia Plan: Spinal   Post-op Pain Management:    Induction: Intravenous  PONV Risk Score and Plan: 1 and Ondansetron, Dexamethasone, Propofol  infusion, TIVA and Treatment may vary due to age or medical condition  Airway Management Planned: Natural Airway  Additional Equipment: None  Intra-op Plan:   Post-operative Plan:   Informed Consent: I have reviewed the patients History and Physical, chart, labs and discussed the procedure including the risks, benefits and alternatives for the proposed anesthesia with the patient or authorized representative who has indicated his/her understanding and acceptance.   Patient has DNR.  Discussed DNR with power of attorney and Suspend DNR.   Consent reviewed with POA  Plan Discussed with: CRNA and Surgeon  Anesthesia Plan Comments: (Per wife, patient had four xylocaine injections in the back when he was in the [employer], and had what sounded like overdose/local anesthesia toxicity. Has since received lidocaine without issue.  Discussed R/B/A of neuraxial anesthesia technique with patient's wife and son: - rare risks of spinal/epidural hematoma, nerve damage, infection - Risk of PDPH - Risk of nausea and vomiting - Risk of conversion to general anesthesia and its associated risks, including sore throat, damage to lips/eyes/teeth/oropharynx, and rare risks such as cardiac and respiratory events. - Risk of allergic reactions - post operative cognitive dysfunction  Discussed the role of CRNA in patient's perioperative care.  Discussed patient's DNR status in great detail. Neither the wife nor son were particularly aware of why he had a DNR bracelet  because they did not recall signing any documents. They did confirm that patient had made his wishes known before of not wishes to end up a vegetable. They confirmed that they believe the patient  would like attempts at resuscitation in the event of cardiac arrest in the perioperative period unless it looked dire, and advised the medical team to use their best judgement.  They voiced understanding.)         Anesthesia Quick Evaluation

## 2023-12-21 NOTE — Progress Notes (Signed)
 PROGRESS NOTE  Benjamin Jackson FMW:[id number] DOB: 06/17/1933 DOA: 12/20/2023 PCP: Delilah Murray HERO., MD   LOS: 1 day   Brief narrative:  Benjamin Jackson is a 89 y.o. male with medical history significant of type II DM, hypertension, depression, hyperlipidemia, right hydronephrosis, GERD, late onset Alzheimer's, mild asthma, class I obesity, obstructive sleep apnea, osteoporosis, peripheral neuropathy, stage III CKD, type 2 diabetes, vitamin D deficiency presented to hospital after sustaining a mechanical fall at this facility with right pelvic pain.  Patient then presented to the hospital.  In the ED vitals were stable.  Lab work showed a hemoglobin of 11.9.  Glucose was elevated at 217.  Chest x-ray without any infiltrate.  Right hip x-ray showed right femoral neck fracture with valgus angulation.  Patient was then considered for admission to the hospital for further evaluation and treatment.      Assessment/Plan: Principal Problem:   Closed right hip fracture, initial encounter [facility]) Active Problems:   Coronary artery disease   Essential hypertension   Gastroesophageal reflux disease without esophagitis   Hyperlipidemia   Iron deficiency anemia   Depressive disorder   Obstructive sleep apnea syndrome   Type 2 diabetes mellitus with hyperglycemia (HCC)   Closed right hip fracture, initial encounter  Continue hip fracture protocol.  Analgesics, antiemetics NPO.  Orthopedic surgery was consulted and plan for surgical intervention.  Likely surgical intervention today.  History of Alzheimer's dementia.  Patient is in a memory care unit at [facility].  He has significant impaired memory.  Does not remember staff.  Denying any symptoms.  Communicated with the patient's wife and confirmed it.     Coronary artery disease On aspirin  and atorvastatin  at home.  Will resume Lipitor for now.  Continue to hold aspirin .     Essential hypertension Will continue to monitor.  Will add as needed  antihypertensives.     Gastroesophageal reflux disease without esophagitis Add Protonix .     Hyperlipidemia On Lipitor at home.  Will resume after surgery.     Iron deficiency anemia Continue to monitor hemoglobin.     Depressive disorder On Zyprexa  and Cymbalta /Atarax  at home.  Will resume after surgery     Obstructive sleep apnea syndrome CPAP at bedtime.     Type 2 diabetes mellitus with hyperglycemia (HCC) Continue sliding scale insulin.  On metformin at home.   DVT prophylaxis: None at present.   Disposition: Might need rehabilitation.  Awaiting for surgical intervention.  Status is: Inpatient Remains inpatient appropriate because: Need for surgical intervention.    Code Status:     Code Status: Limited: Do not attempt resuscitation (DNR) -DNR-LIMITED -Do Not Intubate/DNI   Family Communication: Spoke with the patient's spouse on the phone and updated her about the clinical condition of the patient.  Consultants: Orthopedics  Procedures: None yet  Anti-infectives:  Cefazolin   Anti-infectives (From admission, onward)    Start     Dose/Rate Route Frequency Ordered Stop   12/21/23 1400  ceFAZolin  (ANCEF ) IVPB 2g/100 mL premix        2 g 200 mL/hr over 30 Minutes Intravenous On call to O.R. 12/21/23 1009 12/22/23 0559        Subjective: Today, patient was seen and examined at bedside.  Patient denies any pain, nausea, vomiting, shortness of breath dyspnea.  Patient has dementia and does not remember anything.  Unreliable historian  Objective: Vitals:   12/20/23 2005 12/21/23 0311  BP: (!) 154/81 (!) 164/77  Pulse: 77 70  Resp: 20 16  Temp: 98.8 F (37.1 C) (!) 97.5 F (36.4 C)  SpO2: 99% 100%    Intake/Output Summary (Last 24 hours) at 12/21/2023 1128 Last data filed at 12/20/2023 1915 Gross per 24 hour  Intake 200 ml  Output 500 ml  Net -300 ml   Filed Weights   12/20/23 1002  Weight: 95.3 kg   Body mass index is 32.91 kg/m.    Physical Exam: GENERAL: Patient is alert awake and disoriented, not in obvious distress.  Obese build, elderly male, Communicative, impaired memory HENT: No scleral pallor or icterus. Pupils equally reactive to light. Oral mucosa is moist NECK: is supple, no gross swelling noted. CHEST: Clear to auscultation. No crackles or wheezes.  Diminished breath sounds bilaterally. CVS: S1 and S2 heard, no murmur. Regular rate and rhythm.  ABDOMEN: Soft, non-tender, bowel sounds are present.  Right rib tenderness, external rotation of the right hip EXTREMITIES: No edema. CNS: Cranial nerves are intact.  Impaired memory SKIN: warm and dry without rashes.  Data Review: I have personally reviewed the following laboratory data and studies,  CBC: Recent Labs  Lab 12/20/23 1110 12/21/23 0516  WBC 7.6 7.1  NEUTROABS 5.4  --   HGB 11.9* 11.0*  HCT 38.0* 35.1*  MCV 112.1* 115.1*  PLT 158 144*   Basic Metabolic Panel: Recent Labs  Lab 12/20/23 1110 12/21/23 0516  NA 144 140  K 4.1 4.4  CL 110 111  CO2 23 22  GLUCOSE 217* 184*  BUN 36* 32*  CREATININE 1.00 0.91  CALCIUM  9.0 8.3*   Liver Function Tests: Recent Labs  Lab 12/21/23 0516  AST 117*  ALT 109*  ALKPHOS 151*  BILITOT 1.0  PROT 6.5  ALBUMIN  2.6*   No results for input(s): LIPASE, AMYLASE in the last 168 hours. No results for input(s): AMMONIA in the last 168 hours. Cardiac Enzymes: No results for input(s): CKTOTAL, CKMB, CKMBINDEX, TROPONINI in the last 168 hours. BNP (last 3 results) No results for input(s): BNP in the last 8760 hours.  ProBNP (last 3 results) No results for input(s): PROBNP in the last 8760 hours.  CBG: No results for input(s): GLUCAP in the last 168 hours. Recent Results (from the past 240 hours)  Surgical PCR screen     Status: None   Collection Time: 12/20/23  6:07 PM   Specimen: Nasal Mucosa; Nasal Swab  Result Value Ref Range Status   MRSA, PCR NEGATIVE NEGATIVE Final    Staphylococcus aureus NEGATIVE NEGATIVE Final    Comment: (NOTE) The Xpert SA Assay (FDA approved for NASAL specimens in patients 88 years of age and older), is one component of a comprehensive surveillance program. It is not intended to diagnose infection nor to guide or monitor treatment. Performed at [facility], [address] [place], [place] [address]      Studies: DG Knee Right Port Result Date: 12/20/2023 CLINICAL DATA:  Right femoral neck fracture. EXAM: PORTABLE RIGHT KNEE - 1-2 VIEW COMPARISON:  December 15, 2023. FINDINGS: No evidence of fracture, dislocation, or joint effusion. Moderate to severe narrowing of the patellofemoral, medial and lateral joint spaces is noted. Soft tissues are unremarkable. IMPRESSION: Moderate to severe tricompartmental degenerative joint disease. No acute abnormality seen. Electronically Signed   By: Lynwood Landy Raddle M.D.   On: 12/20/2023 14:39   DG Chest Port 1 View Result Date: 12/20/2023 CLINICAL DATA:  [id number] Pre-op cardiovascular exam [id number] Pre-op evaluation for known right hip fracture. EXAM: PORTABLE  CHEST 1 VIEW COMPARISON:  Radiographs 06/16/2022 and 12/18/2018. FINDINGS: The heart size and mediastinal contours are stable with mild cardiac enlargement. The lungs appear clear. No evidence of pleural effusion or pneumothorax. Stable postsurgical changes from lower cervical fusion and left shoulder reverse arthroplasty. There are old left-sided rib fractures without evidence of acute osseous abnormality. IMPRESSION: No evidence of acute cardiopulmonary process. Stable mild cardiac enlargement. Electronically Signed   By: Elsie Perone M.D.   On: 12/20/2023 11:26   DG Hip Unilat W or Wo Pelvis 2-3 Views Right Result Date: 12/20/2023 CLINICAL DATA:  Fall, right hip pain EXAM: DG HIP (WITH OR WITHOUT PELVIS) 2-3V RIGHT COMPARISON:  CT pelvis 09/11/2023 FINDINGS: Acute right femoral neck fracture with varus angulation. Lower lumbar  spondylosis. Loss of intervertebral disc height at L5-S1. Mild spurring of both acetabula. IMPRESSION: 1. Acute right femoral neck fracture with varus angulation. 2. Lower lumbar spondylosis and degenerative disc disease. Electronically Signed   By: Ryan Salvage M.D.   On: 12/20/2023 10:18      Vernal Alstrom, MD  [facility] 12/21/2023  If 7PM-7AM, please contact night-coverage

## 2023-12-21 NOTE — Interval H&P Note (Signed)
 History and Physical Interval Note:  12/21/2023 12:58 PM  Benjamin Jackson  has presented today for surgery, with the diagnosis of Right femoral neck fracture.  The various methods of treatment have been discussed with the patient and family. After consideration of risks, benefits and other options for treatment, the patient has consented to  Procedure(s): HEMIARTHROPLASTY, HIP, DIRECT ANTERIOR APPROACH, FOR FRACTURE (Right) as a surgical intervention.  The patient's history has been reviewed, patient examined, no change in status, stable for surgery.  I have reviewed the patient's chart and labs.  Questions were answered to the patient's satisfaction.    The risks, benefits, and alternatives were discussed with the patient. There are risks associated with the surgery including, but not limited to, problems with anesthesia (death), infection, instability (giving out of the joint), dislocation, differences in leg length/angulation/rotation, fracture of bones, loosening or failure of implants, hematoma (blood accumulation) which may require surgical drainage, blood clots, pulmonary embolism, nerve injury (foot drop and lateral thigh numbness), and blood vessel injury. The patient understands these risks and elects to proceed.    Benjamin Jackson Keili Hasten

## 2023-12-21 NOTE — Anesthesia Postprocedure Evaluation (Signed)
 Anesthesia Post Note  Patient: DVON JILES  Procedure(s) Performed: HEMIARTHROPLASTY, HIP, DIRECT ANTERIOR APPROACH, FOR FRACTURE (Right: Hip)     Patient location during evaluation: PACU Anesthesia Type: Spinal and MAC Level of consciousness: awake and alert and oriented Pain management: pain level controlled Vital Signs Assessment: post-procedure vital signs reviewed and stable Respiratory status: spontaneous breathing, nonlabored ventilation and respiratory function stable Cardiovascular status: blood pressure returned to baseline and stable Postop Assessment: no headache, no backache, spinal receding and no apparent nausea or vomiting Anesthetic complications: no   No notable events documented.  Last Vitals:  Vitals:   12/21/23 1900 12/21/23 1915  BP: 116/65 124/86  Pulse: 62 72  Resp: 15 16  Temp:    SpO2: 91% 99%    Last Pain:  Vitals:   12/21/23 1219  TempSrc: Axillary  PainSc:                  Almarie CHRISTELLA Marchi

## 2023-12-21 NOTE — Transfer of Care (Signed)
 Immediate Anesthesia Transfer of Care Note  Patient: Benjamin Jackson  Procedure(s) Performed: HEMIARTHROPLASTY, HIP, DIRECT ANTERIOR APPROACH, FOR FRACTURE (Right: Hip)  Patient Location: PACU  Anesthesia Type:MAC and Spinal  Level of Consciousness: sedated  Airway & Oxygen Therapy: Patient Spontanous Breathing and Patient connected to face mask oxygen  Post-op Assessment: Report given to RN and Post -op Vital signs reviewed and stable  Post vital signs: Reviewed and stable  Last Vitals:  Vitals Value Taken Time  BP    Temp    Pulse 67 12/21/23 18:19  Resp 15 12/21/23 18:19  SpO2 100 % 12/21/23 18:19  Vitals shown include unfiled device data.  Last Pain:  Vitals:   12/21/23 1219  TempSrc: Axillary  PainSc:          Complications: No notable events documented.

## 2023-12-21 NOTE — TOC Initial Note (Signed)
 Transition of Care [facility]) - Initial/Assessment Note    Patient Details  Name: Benjamin Jackson MRN: [id number] Date of Birth: 1934-04-09  Transition of Care [facility]) CM/SW Contact:    Bascom Service, RN Phone Number: 12/21/2023, 2:08 PM  Clinical Narrative:Confirmed from [facility] care rep [place] aware-1+asst w/amb/adl's,transfers self, eyeglasses, incontinent B/B.  Await post ortho-R hip hemi sx, & PT eval & recc.                 Expected Discharge Plan: Skilled Nursing Facility Barriers to Discharge: Continued Medical Work up   Patient Goals and CMS Choice Patient states their goals for this hospitalization and ongoing recovery are:: Rehab CMS Medicare.gov Compare Post Acute Care list provided to:: Patient Represenative (must comment) Jethro) Choice offered to / list presented to : Spouse [place] ownership interest in [facility].provided to:: Spouse    Expected Discharge Plan and Services   Discharge Planning Services: CM Consult Post Acute Care Choice: Skilled Nursing Facility Living arrangements for the past 2 months: Post-Acute Facility                                      Prior Living Arrangements/Services Living arrangements for the past 2 months: Post-Acute Facility Lives with:: Facility Resident   Do you feel safe going back to the place where you live?: Yes               Activities of Daily Living   ADL Screening (condition at time of admission) Independently performs ADLs?: No Does the patient have a NEW difficulty with bathing/dressing/toileting/self-feeding that is expected to last >3 days?: No Does the patient have a NEW difficulty with getting in/out of bed, walking, or climbing stairs that is expected to last >3 days?: No Does the patient have a NEW difficulty with communication that is expected to last >3 days?: No Is the patient deaf or have difficulty hearing?: No Does the patient have difficulty seeing, even when wearing  glasses/contacts?: No Does the patient have difficulty concentrating, remembering, or making decisions?: No  Permission Sought/Granted Permission sought to share information with : Case Manager Permission granted to share information with : Yes, Verbal Permission Granted              Emotional Assessment              Admission diagnosis:  Closed left hip fracture (HCC) [S72.002A] Closed fracture of neck of right femur, initial encounter (HCC) [S72.001A] Closed right hip fracture, initial encounter (HCC) [S72.001A] Patient Active Problem List   Diagnosis Date Noted   Gastroesophageal reflux disease without esophagitis 12/20/2023   Depressive disorder 12/20/2023   Osteoporosis 12/20/2023   Stage 3 chronic kidney disease (HCC) 12/20/2023   Closed left hip fracture (HCC) 12/20/2023   Closed right hip fracture, initial encounter (HCC) 12/20/2023   Type II diabetes mellitus (HCC) 12/20/2023   Type 2 diabetes mellitus with hyperglycemia (HCC) 12/20/2023   Late onset Alzheimer's dementia without behavioral disturbance (HCC) 10/27/2020   Essential hypertension 03/02/2016   Type 2 diabetes mellitus with circulatory disorder (HCC) 03/02/2016   Coronary artery disease 03/01/2016   Hypokalemia 02/29/2016   Mild intermittent asthma without complication 08/17/2015   Hyperlipidemia 08/03/2015   Hydronephrosis, right 06/05/2015   Iron deficiency anemia 06/05/2015   Obesity 06/05/2015   Obstructive sleep apnea syndrome 06/05/2015   Peripheral neuropathy 06/05/2015   Vitamin D deficiency  06/05/2015   Fall 03/30/2013   Pelvic contusion 03/30/2013   PCP:  Delilah Murray HERO., MD Pharmacy:   [facility] - [place], [place] - [phone] [address] [place] [address] Phone: [phone] Fax: [phone]     Social Drivers of Health (SDOH) Social History: SDOH Screenings   Food Insecurity: No Food Insecurity (12/20/2023)  Housing: Low Risk   (12/20/2023)  Transportation Needs: No Transportation Needs (12/20/2023)  Utilities: Not At Risk (12/20/2023)  Social Connections: Patient Unable To Answer (12/20/2023)  Tobacco Use: Unknown (12/20/2023)   SDOH Interventions:     Readmission Risk Interventions     No data to display

## 2023-12-21 NOTE — Progress Notes (Signed)
 Initial Nutrition Assessment  INTERVENTION:   -Ensure Plus High Protein po BID, each supplement provides 350 kcal and 20 grams of protein.   NUTRITION DIAGNOSIS:   Increased nutrient needs related to post-op healing, hip fracture as evidenced by estimated needs.  GOAL:   Patient will meet greater than or equal to 90% of their needs  MONITOR:   PO intake, Supplement acceptance  REASON FOR ASSESSMENT:   Consult Hip fracture protocol  ASSESSMENT:   89 y.o. male with medical history significant of type II DM, hypertension, depression, hyperlipidemia, right hydronephrosis, GERD, late onset Alzheimer's, mild asthma, class I obesity, obstructive sleep apnea, osteoporosis, peripheral neuropathy, stage III CKD, type 2 diabetes, vitamin D deficiency presented to hospital after sustaining a mechanical fall at this facility with right pelvic pain. Admitted for right hip fracture.  Patient currently disoriented, with severe dementia. From memory care facility where he had a fall days PTA.  Pt awaiting surgery for right hip fracture. To aid in post-op healing from surgery, will order Ensure supplements for additional kcals and protein.  Per weight records, likely needs updated weight for admission. Current weight is the exact same as recorded weight on 12/16/23 and 03/30/13.   Medications: Vitamin B-12  Labs reviewed.  NUTRITION - FOCUSED PHYSICAL EXAM:  AMS -disoriented  Diet Order:   Diet Order             Diet NPO time specified Except for: Sips with Meds  Diet effective midnight                   EDUCATION NEEDS:   No education needs have been identified at this time  Skin:  Skin Assessment: Reviewed RN Assessment  Last BM:  PTA  Height:   Ht Readings from Last 1 Encounters:  12/20/23 5' 7 (1.702 m)    Weight:   Wt Readings from Last 1 Encounters:  12/20/23 95.3 kg    BMI:  Body mass index is 32.91 kg/m.  Estimated Nutritional Needs:   Kcal:   1650-1850  Protein:  80-90g  Fluid:  1.8L/day   Morna Ortwein, MS, RD, LDN Inpatient Clinical Dietitian Contact via Secure chat

## 2023-12-21 NOTE — Anesthesia Procedure Notes (Signed)
 Spinal  Patient location during procedure: OR Start time: 12/21/2023 3:48 PM End time: 12/21/2023 3:56 PM Reason for block: surgical anesthesia Staffing Performed: anesthesiologist  Anesthesiologist: Merla Almarie HERO, DO Performed by: Merla Almarie HERO, DO Authorized by: Merla Almarie HERO, DO   Preanesthetic Checklist Completed: patient identified, IV checked, risks and benefits discussed, surgical consent, monitors and equipment checked, pre-op evaluation and timeout performed Spinal Block Patient position: right lateral decubitus Prep: DuraPrep and site prepped and draped Patient monitoring: cardiac monitor, continuous pulse ox and blood pressure Approach: midline Location: L3-4 Injection technique: single-shot Needle Needle type: Pencan  Needle gauge: 24 G Needle length: 9 cm Assessment Sensory level: T6 Events: CSF return Additional Notes Functioning IV was confirmed and monitors were applied. Sterile prep and drape, including hand hygiene and sterile gloves were used. The patient was positioned and the spine was prepped. The skin was anesthetized with lidocaine.  Free flow of clear CSF was obtained prior to injecting local anesthetic into the CSF.  The spinal needle aspirated freely following injection.  The needle was carefully withdrawn.  The patient tolerated the procedure well.

## 2023-12-21 NOTE — Op Note (Signed)
 OPERATIVE REPORT  SURGEON: Redell Shoals, MD   ASSISTANT: Valery Potters, PA-C  PREOPERATIVE DIAGNOSIS: Displaced Right femoral neck fracture.   POSTOPERATIVE DIAGNOSIS: Displaced Right femoral neck fracture.   PROCEDURE: Right hip hemiarthroplasty, anterior approach.   IMPLANTS: Biomet Taperloc Complete Reduced Distal stem, size 16 x 152 mm, high offset, with a 28 -3 mm metal head ball and and a 51 mm bipolar head ball.  ANESTHESIA:  MAC and Spinal  ANTIBIOTICS: 2g ancef .  ESTIMATED BLOOD LOSS:-250 mL    DRAINS: None.  COMPLICATIONS: None   CONDITION: PACU - hemodynamically stable.   BRIEF CLINICAL NOTE: Benjamin Jackson is a 89 y.o. male with a displaced Right femoral neck fracture. The patient was admitted to the hospitalist service and underwent perioperative risk stratification and medical optimization. The risks, benefits, and alternatives to hemiarthroplasty were explained, and the patient elected to proceed.  PROCEDURE IN DETAIL: The patient was taken to the operating room and general anesthesia was induced on the hospital bed.  The patient was then positioned on the Hana table.  All bony prominences were well padded.  The hip was prepped and draped in the normal sterile surgical fashion.  A time-out was called verifying side and site of surgery. Antibiotics were given within 60 minutes of beginning the procedure.   Bikini incision was made, and the direct anterior approach to the hip was performed through the Hueter interval.  Superficial dissection was carried out lateral to the ASIS. Lateral femoral circumflex vessels were treated with the Auqumantys. The anterior capsule was exposed and an inverted T capsulotomy was made. Fracture hematoma was encountered and evacuated. The patient was found to have a comminuted Right subcapital femoral neck fracture.  Inferior pubofemoral ligament was released subperiosteally to the lesser trochanter. I freshened the femoral neck cut with a saw.   I removed the femoral neck fragment.  A corkscrew was placed into the head and the head was removed.  This was passed to the back table and was measured.   Acetabular exposure was achieved.  I examined the articular cartilage which was intact.  The labrum was intact. A 51 mm trial head was placed and found to have excellent fit.   I then gained femoral exposure taking care to protect the abductors and greater trochanter.  The superior capsule was incised longitudinally, staying lateral to the posterior border of the femoral neck. External rotation, extension, and adduction were applied.  A cookie cutter was used to enter the femoral canal, and then the femoral canal finder was used to confirm location.  I then sequentially broached up to a size 16.  Calcar planer was used on the femoral neck remnant.  I placed a high offset neck and a trial bipolar head ball. The hip was reduced.  Leg lengths were checked fluoroscopically.  The hip was dislocated and trial components were removed.  I placed the real stem followed by the real spacer and head ball.  A single reduction maneuver was performed and the hip was reduced.  Fluoroscopy was used to confirm component position and leg lengths.  At 90 degrees of external rotation and extension, the hip was stable to an anterior directed force.   The wound was copiously irrigated with Irrisept solution and normal saline using pulse lavage.  Marcaine  solution was injected into the periarticular soft tissue.  The wound was closed in layers using #1 Stratafix for the fascia, 2-0 Vicryl for the subcutaneous fat, 2-0 Monocryl for the deep dermal layer,  and staples + Dermabond for the skin.  Once the glue was fully dried, an Aquacell Ag dressing was applied.  The patient was then awakened from anesthesia and transported to the recovery room in stable condition.  Sponge, needle, and instrument counts were correct at the end of the case x2.  The patient tolerated the procedure well  and there were no known complications.  Please note that a surgical assistant was a medical necessity for this procedure to perform it in a safe and expeditious manner. Assistant was necessary to provide appropriate retraction of vital neurovascular structures, to prevent femoral fracture, and to allow for anatomic placement of the prosthesis.

## 2023-12-22 ENCOUNTER — Encounter (HOSPITAL_COMMUNITY): Payer: Self-pay | Admitting: Orthopedic Surgery

## 2023-12-22 DIAGNOSIS — S72001A Fracture of unspecified part of neck of right femur, initial encounter for closed fracture: Secondary | ICD-10-CM | POA: Diagnosis not present

## 2023-12-22 LAB — CBC
HCT: 31 % — ABNORMAL LOW (ref 39.0–52.0)
Hemoglobin: 9.6 g/dL — ABNORMAL LOW (ref 13.0–17.0)
MCH: 36.1 pg — ABNORMAL HIGH (ref 26.0–34.0)
MCHC: 31 g/dL (ref 30.0–36.0)
MCV: 116.5 fL — ABNORMAL HIGH (ref 80.0–100.0)
Platelets: 125 K/uL — ABNORMAL LOW (ref 150–400)
RBC: 2.66 MIL/uL — ABNORMAL LOW (ref 4.22–5.81)
RDW: 13.1 % (ref 11.5–15.5)
WBC: 8 K/uL (ref 4.0–10.5)
nRBC: 0 % (ref 0.0–0.2)

## 2023-12-22 LAB — BASIC METABOLIC PANEL WITH GFR
Anion gap: 8 (ref 5–15)
BUN: 36 mg/dL — ABNORMAL HIGH (ref 8–23)
CO2: 20 mmol/L — ABNORMAL LOW (ref 22–32)
Calcium: 7.9 mg/dL — ABNORMAL LOW (ref 8.9–10.3)
Chloride: 109 mmol/L (ref 98–111)
Creatinine, Ser: 1.1 mg/dL (ref 0.61–1.24)
GFR, Estimated: >60 mL/min (ref >60)
Glucose, Bld: 185 mg/dL — ABNORMAL HIGH (ref 70–99)
Potassium: 4.3 mmol/L (ref 3.5–5.1)
Sodium: 137 mmol/L (ref 135–145)

## 2023-12-22 MED ORDER — ASPIRIN 81 MG PO CHEW: 81.0000 mg | CHEWABLE_TABLET | Freq: Two times a day (BID) | ORAL | 0 refills | Status: AC

## 2023-12-22 MED ORDER — HYDROCODONE-ACETAMINOPHEN 5-325 MG PO TABS: 1.0000 | ORAL_TABLET | ORAL | 0 refills | Status: AC | PRN

## 2023-12-22 NOTE — Evaluation (Signed)
 Occupational Therapy Evaluation Patient Details Name: Benjamin Jackson MRN: [id number] DOB: [birth year] Today's Date: 12/22/2023   History of Present Illness   Mr. Furey is a 89 yr old male who had a fall and sustained a displaced R femoral neck fracture. He is s/p a hemiarthroplasty on 12-21-23 (anterior approach). PMH: CKD III, DM II, HTN, HLD, late onset Alzheimer's OSA, OP, peripheral neuropathy     Clinical Impressions The pt is currently presenting with the below listed deficits (see OT problem list). As such, his ADL performance is compromised and he requires assist for self-care management. At current, he requires max to total assist for tasks, including lower body dressing, supine to sit, and upper body dressing. Once seated EOB, he required min-mod assist to maintain static sitting. He was oriented to his name only, and required significant assistance and cues for re-direction, attention, memory, problem solving, and initiation. He was noted to grimace, guard, and moan with movement of his RLE. OT will trial him for OT services in the acute care setting, with the aim of facilitating improved ADL performance & to decrease the risk for further weakness and deconditioning. He will need around the clock assistance and supervision, once discharged from the hospital.      If plan is discharge home, recommend the following:   Two people to help with walking and/or transfers;A lot of help with bathing/dressing/bathroom;Supervision due to cognitive status;Direct supervision/assist for medications management;Assistance with feeding     Functional Status Assessment   Patient has had a recent decline in their functional status and demonstrates the ability to make significant improvements in function in a reasonable and predictable amount of time.     Equipment Recommendations   Other (comment) (defer to next level of care)     Recommendations for Other Services          Precautions/Restrictions   Precautions Precautions: Fall Restrictions Weight Bearing Restrictions Per Provider Order: Yes RLE Weight Bearing Per Provider Order: Weight bearing as tolerated     Mobility Bed Mobility Overal bed mobility: Needs Assistance Bed Mobility: Supine to Sit, Sit to Supine     Supine to sit: Total assist, +2 for physical assistance Sit to supine: Total assist, +2 for physical assistance            Balance     Sitting balance-Leahy Scale: Poor         ADL either performed or assessed with clinical judgement   ADL Overall ADL's : Needs assistance/impaired Eating/Feeding: Maximal assistance;Bed level;Cueing for safety;Cueing for sequencing               Upper Body Dressing : Total assistance;Bed level;Cueing for safety;Cueing for sequencing   Lower Body Dressing: Total assistance;Bed level;Cueing for safety;Cueing for sequencing       Toileting- Clothing Manipulation and Hygiene: Total assistance;Bed level Toileting - Clothing Manipulation Details (indicate cue type and reason): based on clinical judgement              Pertinent Vitals/Pain Pain Assessment Pain Assessment: Faces Pain Score: 6  Pain Location: RLE with movement Pain Descriptors / Indicators: Grimacing, Guarding, Moaning Pain Intervention(s): Limited activity within patient's tolerance, Monitored during session, Repositioned     Extremity/Trunk Assessment Upper Extremity Assessment Upper Extremity Assessment: Difficult to assess due to impaired cognition RUE Deficits / Details: Pt could not follow consistent commands in order to participate in formal ROM and strength testing. He was noted to demo functional grip strength through informal observation. Elbow  and hand AROM appeared grossly WFL LUE Deficits / Details: Pt could not follow consistent commands in order to participate in formal ROM and strength testing. He was noted to demo functional grip strength  through informal observation. Elbow and hand AROM appeared grossly [facility]   Lower Extremity Assessment Lower Extremity Assessment: Difficult to assess due to impaired cognition RLE Deficits / Details: significant guarding of LE noted when mobility was attempted, presumably due to pain       Communication     Cognition Arousal: Alert Behavior During Therapy: Anxious, Restless Cognition: History of cognitive impairments, Cognition impaired   Orientation impairments: Place, Time, Situation Awareness: Intellectual awareness impaired, Online awareness impaired Memory impairment (select all impairments): Short-term memory, Declarative long-term memory, Non-declarative long-term memory Attention impairment (select first level of impairment): Alternating attention, Divided attention, Selective attention, Sustained attention Executive functioning impairment (select all impairments): Initiation, Organization, Sequencing, Reasoning OT - Cognition Comments: Oriented to name only.                 Following commands: Impaired Following commands impaired: Follows one step commands inconsistently     Cueing  General Comments   Cueing Techniques: Verbal cues;Gestural cues;Tactile cues              Home Living Family/patient expects to be discharged to:: Skilled nursing facility     Type of Home: Other(Comment) (Per his medical chart, he resides at [place])    Additional Comments: The pt was unable to provide information regarding his prior level of functioning and living situation, due to impaired cognition, secondary to Alzheimer's disease.      Prior Functioning/Environment Prior Level of Function : Patient poor historian/Family not available         OT Problem List: Decreased strength;Decreased range of motion;Decreased activity tolerance;Impaired balance (sitting and/or standing);Decreased cognition;Decreased safety awareness;Decreased knowledge of use of DME  or AE;Decreased knowledge of precautions;Pain   OT Treatment/Interventions: Self-care/ADL training;Therapeutic exercise;Therapeutic activities;Cognitive remediation/compensation;Energy conservation;DME and/or AE instruction;Patient/family education;Balance training      OT Goals(Current goals can be found in the care plan section)   Acute Rehab OT Goals Patient Stated Goal: pt did not specifically state OT Goal Formulation: Patient unable to participate in goal setting Time For Goal Achievement: 01/05/24 Potential to Achieve Goals: Fair ADL Goals Pt Will Perform Eating: with min assist;sitting Pt Will Perform Grooming: with min assist;sitting Pt Will Perform Upper Body Dressing: with mod assist;sitting Additional ADL Goal #1: The pt will perform supine to sit with mod assist, in prep for progressive ADL participation.   OT Frequency:  Min 1X/week       AM-PAC OT 6 Clicks Daily Activity     Outcome Measure Help from another person eating meals?: A Lot Help from another person taking care of personal grooming?: Total Help from another person toileting, which includes using toliet, bedpan, or urinal?: Total Help from another person bathing (including washing, rinsing, drying)?: Total Help from another person to put on and taking off regular upper body clothing?: Total Help from another person to put on and taking off regular lower body clothing?: Total 6 Click Score: 7   End of Session Equipment Utilized During Treatment: Other (comment) (N/A) Nurse Communication: Mobility status  Activity Tolerance: Patient limited by pain Patient left: in bed;with call bell/phone within reach;with bed alarm set  OT Visit Diagnosis: History of falling (Z91.81);Other symptoms and signs involving cognitive function;Pain;Muscle weakness (generalized) (M62.81);Other abnormalities of gait and mobility (R26.89);Cognitive communication deficit (R41.841)  Pain - Right/Left: Right Pain - part of body:  Hip                Time: 8461-8442 OT Time Calculation (min): 19 min Charges:  OT General Charges $OT Visit: 1 Visit OT Evaluation $OT Eval Moderate Complexity: 1 Mod    Kyria Bumgardner L Jianni Shelden, OTR/L 12/22/2023, 4:55 PM

## 2023-12-22 NOTE — Plan of Care (Signed)
   Problem: Activity: Goal: Risk for activity intolerance will decrease Outcome: Progressing   Problem: Nutrition: Goal: Adequate nutrition will be maintained Outcome: Progressing

## 2023-12-22 NOTE — Progress Notes (Signed)
    Subjective: Patient alert, oriented x0. Does not follow commands.  Unable to fully assess pain, he does endorse some pain with hip movement.  No acute events reported overnight by nursing. He is having difficulty swallowing, they are going to reach out to primary team about this this am. No family at bedside.   Objective:   VITALS:   Vitals:   12/21/23 1953 12/21/23 1955 12/21/23 2358 12/22/23 0330  BP: (!) 146/71 (!) 146/71 127/75 (!) 146/76  Pulse: 67 69 78 82  Resp:  (!) 25 18 18   Temp:  99 F (37.2 C) 98.9 F (37.2 C) 98.7 F (37.1 C)  TempSrc:  Oral Oral Oral  SpO2: 100% 100% 99% 100%  Weight:      Height:        NAD ABD soft Neurovascular intact Intact pulses distally Incision: dressing C/D/I No cellulitis present Compartment soft He moved his toes this morning, does not follow commands to move feet and ankles.  Unable to fully assess motor function and sensation due to mental status, does respond to some stimuli in his feet.   Lab Results  Component Value Date   WBC 8.0 12/22/2023   HGB 9.6 (L) 12/22/2023   HCT 31.0 (L) 12/22/2023   MCV 116.5 (H) 12/22/2023   PLT 125 (L) 12/22/2023   BMET    Component Value Date/Time   NA 137 12/22/2023 0442   K 4.3 12/22/2023 0442   CL 109 12/22/2023 0442   CO2 20 (L) 12/22/2023 0442   GLUCOSE 185 (H) 12/22/2023 0442   BUN 36 (H) 12/22/2023 0442   CREATININE 1.10 12/22/2023 0442   CALCIUM  7.9 (L) 12/22/2023 0442   GFRNONAA >60 12/22/2023 0442     Assessment/Plan: 1 Day Post-Op   Principal Problem:   Closed right hip fracture, initial encounter [facility]) Active Problems:   Coronary artery disease   Essential hypertension   Gastroesophageal reflux disease without esophagitis   Hyperlipidemia   Iron deficiency anemia   Depressive disorder   Obstructive sleep apnea syndrome   Type 2 diabetes mellitus with hyperglycemia (HCC)  ABLA hemoglobin 9.6. Continue to monitor.   WBAT with walker DVT ppx: Aspirin ,  SCDs, TEDS PO pain control PT/OT: To come today.  Dispo:  - Patient under care of the medical team, disposition per their recommendation. Pain medication and DVT ppx printed in chart.    Valery GORMAN Potters 12/22/2023, 7:34 AM   [facility]  [facility] [address], [place], [place] [address] Phone: [phone] [email or web address] Facebook  [employer]

## 2023-12-22 NOTE — Plan of Care (Signed)

## 2023-12-22 NOTE — Progress Notes (Signed)
 [facility] PROGRESS NOTE  Benjamin Jackson (DOB: 09-May-1934) FMW:[id number] PCP: Delilah Murray HERO., MD  Brief Narrative: Benjamin Jackson is an 89 y.o. male with a history of T2DM, HTN, HLD, stage IIIa CKD, OSA, Alzheimer's dementia, obesity, vitamin D deficiency, osteoporosis, GERD, depression who presented to the ED on 12/20/2023 after a fall at his facility with right hip pain confirmed to have a right femoral neck fracture with valgus angulation. He was admitted, ultimately underwent anterior-approach right hemiarthroplasty 7/24. Postoperative therapy evaluations are pending.   Subjective: Pt not oriented but without complaint.   Objective: BP (!) 144/76 (BP Location: Right Arm)   Pulse 75   Temp 99.6 F (37.6 C) (Oral)   Resp 20   Ht 5' 7 (1.702 m)   Wt 95.3 kg   SpO2 100%   BMI 32.91 kg/m   Gen: No distress, elderly  Pulm: Clear, nonlabored, diminished  CV: RRR, no MRG GI: Soft, NT, ND, +BS  Neuro: Alert and disoriented, not cooperative with most commands. Does resist movement in R foot with good strength, withdraws to nailbed pressure as well. Ext: Warm, dry Skin: R anterolateral hip incision dressing c/d/i    Assessment & Plan: Closed displaced right hip fracture: s/p right THA, anterior approach by Dr. Fidel 7/24.  - Continue routine postoperative care, orthopedics following.  - PT/OT consulted - Trend hgb - Continue analgesia (with bowel regimen) and VTE ppx per orthopedics.     Dysphagia: Reported significant difficulty giving pills this AM and some pocketing.  - SLP evaluation recommendations are appreciated.   Alzheimer's dementia: Resides at [facility].  - Delirium precautions. Ideally would return to familiar environment at discharge.  - Continue olanzapine  BID - Continue rivastigmine  patch  CAD, HLD: No chest pain reported.  - Continue statin. Full dose daily aspirin  for VTE ppx per orthopedics, after this duration, plan to return to baseline  81mg  dosing.   T2DM:  - Continue SSI, hold home metformin for now.   HTN:  - prn hydralazine   GERD:  - +PPI  Postoperative blood loss anemia on chronic iron deficiency anemia:  - Monitor H&H, continue po iron  OSA:  - CPAP qHS  [name], MD [facility] [email or web address] 12/22/2023, 2:51 PM

## 2023-12-22 NOTE — Evaluation (Signed)
 Clinical/Bedside Swallow Evaluation Patient Details  Name: Benjamin Jackson MRN: [id number] Date of Birth: Jun 24, 1933  Today's Date: 12/22/2023 Time: SLP Start Time (ACUTE ONLY): 1505 SLP Stop Time (ACUTE ONLY): 1530 SLP Time Calculation (min) (ACUTE ONLY): 25 min  Past Medical History:  Past Medical History:  Diagnosis Date   Diabetes mellitus without complication (HCC)    Hypertension    Past Surgical History:  Past Surgical History:  Procedure Laterality Date   ANTERIOR APPROACH HEMI HIP ARTHROPLASTY Right 12/21/2023   Procedure: HEMIARTHROPLASTY, HIP, DIRECT ANTERIOR APPROACH, FOR FRACTURE;  Surgeon: Fidel Rogue, MD;  Location: [facility];  Service: Orthopedics;  Laterality: Right;   COLON SURGERY     removed 36 inches   NEPHROSTOMY     nephrostomy reversal     HPI:  Benjamin Jackson is a 89 y.o. male with past medical history significant of type II DM, hypertension, depression, hyperlipidemia, right hydronephrosis, GERD, late onset Alzheimer's, mild asthma, class I obesity, obstructive sleep apnea, osteoporosis, peripheral neuropathy, stage III CKD, type 2 diabetes, vitamin D deficiency admitted from memory care facility with right femoral neck fracture s/p repair on 7/24.    Assessment / Plan / Recommendation  Clinical Impression  Patient presents with a suspected cognitively based dysphagia. Per RN, patient with significant oral holding and eventually anterior labial spillage when attempting any pos this am. Patient with significant confusion and intermittent agitation yelling at clinician wait a minute! However, with set up assistance including verbal, visual, and tactile cues to improve awareness of situation, patient began to self feed and proceeded to efficiently feed himself 10 ounces of pureed solid, 2 crackers, and over 8 ounces of thin liquids without hesitation, thanking clinician at the end of session. He presented with efficient and appropriate mastication of all solids and  good oral containment of liquids. One x cough post swallow thin liquids, strong in nature, and suspected efficient to clear potential penetrates/aspirates. He did also present with intermittent wet vocal quality during po intake which also cleared spontaneously with subsequent swallows. Although some s/s of decreased airway present, overall swallowing function appears appropriate to remain on a regular diet. Patient will need full supervision for set up assistance and to monitor for rate of intake and s/s of aspiration. SLP will f/u for tolerance. SLP Visit Diagnosis: Dysphagia, unspecified (R13.10)    Aspiration Risk  Mild aspiration risk    Diet Recommendation Regular;Thin liquid    Liquid Administration via: Cup;Straw Medication Administration: Whole meds with puree (or crushed if needed, allowing to self feed) Supervision: Patient able to self feed;Staff to assist with self feeding;Full supervision/cueing for compensatory strategies Compensations: Slow rate;Small sips/bites;Minimize environmental distractions Postural Changes: Seated upright at 90 degrees    Other  Recommendations Oral Care Recommendations: Oral care BID        Functional Status Assessment Patient has had a recent decline in their functional status and demonstrates the ability to make significant improvements in function in a reasonable and predictable amount of time.  Frequency and Duration min 1 x/week  1 week       Prognosis Barriers to Reach Goals: Cognitive deficits      Swallow Study   General HPI: Benjamin Jackson is a 89 y.o. male with past medical history significant of type II DM, hypertension, depression, hyperlipidemia, right hydronephrosis, GERD, late onset Alzheimer's, mild asthma, class I obesity, obstructive sleep apnea, osteoporosis, peripheral neuropathy, stage III CKD, type 2 diabetes, vitamin D deficiency admitted from memory care facility  with right femoral neck fracture s/p repair on 7/24. Type of  Study: Bedside Swallow Evaluation Previous Swallow Assessment: Based on notes from [facility] 10/23/20; patient with h/o dysphagia possibly esophageal in nature with h/o multiple esophageal dilations. Overall, swallowing noted to be [facility] based on bedside exam this date. Note that at that time patient and spouse reported prior completion of barium (GI) and modified barium swallow (SLP) studies, in 2019 and that at that time, patient demonstrated overall swallow function WNL for age with mild pharyngeal deficits noted. Diet Prior to this Study: Regular;Thin liquids (Level 0) Temperature Spikes Noted: Yes Respiratory Status: Room air History of Recent Intubation: No (surgery only) Behavior/Cognition: Alert;Cooperative;Confused;Impulsive;Requires cueing Oral Cavity Assessment: Dry Oral Care Completed by SLP: Recent completion by staff Oral Cavity - Dentition: Adequate natural dentition Vision: Functional for self-feeding Self-Feeding Abilities: Able to feed self;Needs assist (set up assist) Patient Positioning: Upright in bed Baseline Vocal Quality: Normal Volitional Cough: Cognitively unable to elicit Volitional Swallow: Unable to elicit    Oral/Motor/Sensory Function Overall Oral Motor/Sensory Function: Within functional limits   Ice Chips Ice chips: Not tested   Thin Liquid Thin Liquid: Within functional limits (see impression statement) Presentation: Self Fed;Straw    Nectar Thick Nectar Thick Liquid: Not tested   Honey Thick Honey Thick Liquid: Not tested   Puree Puree: Within functional limits Presentation: Self Fed;Spoon   Solid     Solid: Within functional limits Presentation: Self Fed;Spoon     Ciella Obi MA, CCC-SLP  Makensie Mulhall Meryl 12/22/2023,3:43 PM

## 2023-12-22 NOTE — TOC Progression Note (Signed)
 Transition of Care [facility]) - Progression Note    Patient Details  Name: ARTAVIS COWIE MRN: [id number] Date of Birth: [birth year]  Transition of Care [facility]) CM/SW Contact  Burna Atlas, Nathanel, RN Phone Number: 12/22/2023, 10:47 AM  Clinical Narrative:  s/p R hip hemi;await PT eval & recc.    Expected Discharge Plan: Skilled Nursing Facility Barriers to Discharge: Continued Medical Work up               Expected Discharge Plan and Services   Discharge Planning Services: CM Consult Post Acute Care Choice: Skilled Nursing Facility Living arrangements for the past 2 months: Post-Acute Facility                                       Social Drivers of Health (SDOH) Interventions SDOH Screenings   Food Insecurity: No Food Insecurity (12/20/2023)  Housing: Low Risk  (12/20/2023)  Transportation Needs: No Transportation Needs (12/20/2023)  Utilities: Not At Risk (12/20/2023)  Social Connections: Patient Unable To Answer (12/20/2023)  Tobacco Use: Unknown (12/20/2023)    Readmission Risk Interventions    12/21/2023    2:10 PM  Readmission Risk Prevention Plan  Transportation Screening Complete  PCP or Specialist Appt within 3-5 Days Complete  HRI or Home Care Consult Complete  Palliative Care Screening Not Applicable

## 2023-12-23 DIAGNOSIS — S72001A Fracture of unspecified part of neck of right femur, initial encounter for closed fracture: Secondary | ICD-10-CM | POA: Diagnosis not present

## 2023-12-23 LAB — BASIC METABOLIC PANEL WITH GFR
Anion gap: 8 (ref 5–15)
BUN: 32 mg/dL — ABNORMAL HIGH (ref 8–23)
CO2: 24 mmol/L (ref 22–32)
Calcium: 8.4 mg/dL — ABNORMAL LOW (ref 8.9–10.3)
Chloride: 109 mmol/L (ref 98–111)
Creatinine, Ser: 1.01 mg/dL (ref 0.61–1.24)
GFR, Estimated: >60 mL/min (ref >60)
Glucose, Bld: 186 mg/dL — ABNORMAL HIGH (ref 70–99)
Potassium: 4.4 mmol/L (ref 3.5–5.1)
Sodium: 141 mmol/L (ref 135–145)

## 2023-12-23 LAB — CBC
HCT: 28.8 % — ABNORMAL LOW (ref 39.0–52.0)
Hemoglobin: 9.4 g/dL — ABNORMAL LOW (ref 13.0–17.0)
MCH: 35.9 pg — ABNORMAL HIGH (ref 26.0–34.0)
MCHC: 32.6 g/dL (ref 30.0–36.0)
MCV: 109.9 fL — ABNORMAL HIGH (ref 80.0–100.0)
Platelets: 173 K/uL (ref 150–400)
RBC: 2.62 MIL/uL — ABNORMAL LOW (ref 4.22–5.81)
RDW: 13.1 % (ref 11.5–15.5)
WBC: 9.5 K/uL (ref 4.0–10.5)
nRBC: 0 % (ref 0.0–0.2)

## 2023-12-23 LAB — GLUCOSE, CAPILLARY
Glucose-Capillary: 155 mg/dL — ABNORMAL HIGH (ref 70–99)
Glucose-Capillary: 264 mg/dL — ABNORMAL HIGH (ref 70–99)
Glucose-Capillary: 203 mg/dL — ABNORMAL HIGH (ref 70–99)
Glucose-Capillary: 242 mg/dL — ABNORMAL HIGH (ref 70–99)

## 2023-12-23 MED ORDER — SODIUM CHLORIDE 0.9 % IV SOLN: INTRAVENOUS | Status: AC

## 2023-12-23 MED ORDER — INSULIN ASPART 100 UNIT/ML IJ SOLN
0.0000 [IU] | Freq: Three times a day (TID) | INTRAMUSCULAR | Status: DC
  Administered 2023-12-23: 3 [IU] via SUBCUTANEOUS
  Administered 2023-12-24 (×2): 2 [IU] via SUBCUTANEOUS
  Administered 2023-12-24: 5 [IU] via SUBCUTANEOUS
  Administered 2023-12-25 (×2): 2 [IU] via SUBCUTANEOUS
  Administered 2023-12-25 – 2023-12-26 (×2): 3 [IU] via SUBCUTANEOUS
  Administered 2023-12-26 (×2): 2 [IU] via SUBCUTANEOUS
  Administered 2023-12-27: 5 [IU] via SUBCUTANEOUS
  Administered 2023-12-27: 2 [IU] via SUBCUTANEOUS

## 2023-12-23 MED ORDER — INSULIN ASPART 100 UNIT/ML IJ SOLN
0.0000 [IU] | Freq: Every day | INTRAMUSCULAR | Status: DC
  Administered 2023-12-23 – 2023-12-24 (×2): 2 [IU] via SUBCUTANEOUS
  Administered 2023-12-25: 3 [IU] via SUBCUTANEOUS

## 2023-12-23 NOTE — Progress Notes (Signed)
 Progress Note   Patient: Benjamin Jackson FMW:[id number] DOB: August 28, 1933 DOA: 12/20/2023     3 DOS: the patient was seen and examined on 12/23/2023    Brief Narrative: Benjamin Jackson is an 89 y.o. male with a history of T2DM, HTN, HLD, stage IIIa CKD, OSA, Alzheimer's dementia, obesity, vitamin D deficiency, osteoporosis, GERD, depression who presented to the ED on 12/20/2023 after a fall at his facility with right hip pain confirmed to have a right femoral neck fracture with valgus angulation. He was admitted, ultimately underwent anterior-approach right hemiarthroplasty 7/24. Postoperative therapy evaluations are pending.    Assessment & Plan: Closed displaced right hip fracture: s/p right THA, anterior approach by Dr. Fidel 7/24.  - Continue routine postoperative care, orthopedics following.  - PT/OT consulted - Continue analgesia (with bowel regimen) and VTE ppx per orthopedics.     Dysphagia: Reported significant difficulty giving pills this AM and some pocketing.  - SLP evaluation recommendations are appreciated.    Alzheimer's dementia: Resides at [facility].  - Delirium precautions. Ideally would return to familiar environment at discharge.  - Continue olanzapine  BID - Continue rivastigmine  patch   CAD, HLD: No chest pain reported.  - Continue statin. Full dose daily aspirin  for VTE ppx per orthopedics, after this duration, plan to return to baseline 81mg  dosing.    T2DM:  - Continue SSI, hold home metformin for now.    HTN:  - prn hydralazine    GERD:  - +PPI   Postoperative blood loss anemia on chronic iron deficiency anemia:  - Monitor H&H, continue po iron   OSA:  - CPAP qHS     Family Communication: None at bedside  Disposition: Pending SNF Status is: Inpatient  Time spent: 41 minutes  Subjective:  Patient disoriented and confused however able to communicate and denies any acute complaints No acute overnight event reported  Physical Exam: Gen:   Pulm: Clear, nonlabored, diminished  CV: RRR, no MRG GI: Soft, NT, ND, +BS  Neuro: Alert and disoriented, not cooperative with most commands. Does resist movement in R foot with good strength, withdraws to nailbed pressure as well. Ext: Warm, dry Skin: R anterolateral hip incision dressing c/d/i    Data Reviewed:    Latest Ref Rng & Units 12/23/2023    5:03 AM 12/22/2023    4:42 AM 12/21/2023    5:16 AM  CBC  WBC 4.0 - 10.5 K/uL 9.5  8.0  7.1   Hemoglobin 13.0 - 17.0 g/dL 9.4  9.6  88.9   Hematocrit 39.0 - 52.0 % 28.8  31.0  35.1   Platelets 150 - 400 K/uL 173  125  144        Latest Ref Rng & Units 12/23/2023    5:03 AM 12/22/2023    4:42 AM 12/21/2023    5:16 AM  BMP  Glucose 70 - 99 mg/dL 813  814  815   BUN 8 - 23 mg/dL 32  36  32   Creatinine 0.61 - 1.24 mg/dL 8.98  8.89  9.08   Sodium 135 - 145 mmol/L 141  137  140   Potassium 3.5 - 5.1 mmol/L 4.4  4.3  4.4   Chloride 98 - 111 mmol/L 109  109  111   CO2 22 - 32 mmol/L 24  20  22    Calcium  8.9 - 10.3 mg/dL 8.4  7.9  8.3     Vitals:   12/22/23 1355 12/22/23 1941 12/22/23 2102 12/23/23 9682  BP:  108/85  126/65  Pulse:  (!) 44  77  Resp:  18 (!) 26 18  Temp: 99.6 F (37.6 C) (!) 97.5 F (36.4 C)  99.2 F (37.3 C)  TempSrc: Oral Oral  Oral  SpO2: 100% 97%  (!) 84%  Weight:      Height:          Author: Drue ONEIDA Potter, MD 12/23/2023 1:40 PM  For on call review [email or web address].

## 2023-12-23 NOTE — Evaluation (Signed)
 Physical Therapy Evaluation Patient Details Name: Benjamin Jackson MRN: [id number] DOB: 1933-11-24 Today's Date: 12/23/2023  History of Present Illness  Benjamin Jackson is a 89 yr old male who had a fall and sustained a displaced R femoral neck fracture. He is s/p Right hip hemiarthroplasty, anterior approach on 12-21-23. PMH: CKD III, DM II, HTN, HLD, OSA, Alzheimer's dementia, obesity, vitamin D deficiency, osteoporosis, GERD, depression  Clinical Impression  Pt admitted with above diagnosis.  Pt currently with functional limitations due to the deficits listed below (see PT Problem List). Pt will benefit from acute skilled PT to increase their independence and safety with mobility to allow discharge.  Pt assisted with sitting and standing at edge of bed.  Pt moaning in pain with assist/support of R LE and movement in general.  Pt appears at baseline cognition of only oriented to self Benjamin Jackson but appeared better able to at least inconsistently follow commands with multimodal cues provided.  Pt currently requiring +2 extensive assist and will likely need higher level of care setting prior to return to memory care unit (baseline +1 assist per notes).  Patient will benefit from continued inpatient follow up therapy, <3 hours/day.          If plan is discharge home, recommend the following: Two people to help with walking and/or transfers;Two people to help with bathing/dressing/bathroom   Can travel by private vehicle   No    Equipment Recommendations Other (comment) (per next venue)  Recommendations for Other Services       Functional Status Assessment Patient has had a recent decline in their functional status and demonstrates the ability to make significant improvements in function in a reasonable and predictable amount of time.     Precautions / Restrictions Precautions Precautions: Fall Precaution/Restrictions Comments: HOH, only oriented to self Restrictions RLE Weight Bearing Per Provider  Order: Weight bearing as tolerated      Mobility  Bed Mobility Overal bed mobility: Needs Assistance Bed Mobility: Supine to Sit, Sit to Supine     Supine to sit: Total assist, +2 for physical assistance Sit to supine: Total assist, +2 for physical assistance   General bed mobility comments: reliant on total +2 assist, moans in pain    Transfers Overall transfer level: Needs assistance Equipment used: Rolling walker (2 wheels) Transfers: Sit to/from Stand Sit to Stand: Max assist, +2 physical assistance           General transfer comment: multimodal cues for positioning; assist to rise and stabilize, significant right lateral lean and pt unable to maintain standing without significant assist, also moaning in pain so assisted safely back to sitting    Ambulation/Gait                  Stairs            Wheelchair Mobility     Tilt Bed    Modified Rankin (Stroke Patients Only)       Balance Overall balance assessment: Needs assistance Sitting-balance support: Feet supported, Bilateral upper extremity supported Sitting balance-Leahy Scale: Poor Sitting balance - Comments: once pt assisted to sitting EOB, pt able to maintain upright posture with self UE support, no external physical support required   Standing balance support: Bilateral upper extremity supported, Reliant on assistive device for balance Standing balance-Leahy Scale: Zero Standing balance comment: extensive assist required, right lateral lean present  Pertinent Vitals/Pain Pain Assessment Pain Assessment: Faces Faces Pain Scale: Hurts whole lot Pain Descriptors / Indicators: Grimacing, Guarding, Moaning Pain Intervention(s): Repositioned, Monitored during session (RN notified of pain with movement, appears okay at rest)    Home Living Family/patient expects to be discharged to:: Skilled nursing facility     Type of Home: Other(Comment)  Horticulturist, commercial memory care unit)                  Prior Function Prior Level of Function : Patient poor historian/Family not available             Mobility Comments: per TOC note, pt from memory care unit and x1 assist w/amb/adl's,transfers self, incontinent B&B       Extremity/Trunk Assessment        Lower Extremity Assessment Lower Extremity Assessment: Difficult to assess due to impaired cognition;Generalized weakness RLE Deficits / Details: significant guarding of LE and pt moaning in pain with attempt of movement/assist    Cervical / Trunk Assessment Cervical / Trunk Assessment: Normal  Communication   Communication Communication: Impaired Factors Affecting Communication: Hearing impaired    Cognition Arousal: Alert     PT - Cognitive impairments: History of cognitive impairments                       PT - Cognition Comments: hx dementia, oriented to self only Following commands: Impaired Following commands impaired: Follows one step commands inconsistently, Follows one step commands with increased time     Cueing Cueing Techniques: Verbal cues, Gestural cues, Tactile cues     General Comments      Exercises     Assessment/Plan    PT Assessment Patient needs continued PT services  PT Problem List Decreased strength;Pain;Decreased cognition;Decreased activity tolerance;Decreased balance;Decreased mobility;Decreased safety awareness;Decreased knowledge of precautions;Decreased knowledge of use of DME       PT Treatment Interventions DME instruction;Gait training;Balance training;Functional mobility training;Therapeutic activities;Therapeutic exercise;Patient/family education;Wheelchair mobility training    PT Goals (Current goals can be found in the Care Plan section)  Acute Rehab PT Goals PT Goal Formulation: Patient unable to participate in goal setting Time For Goal Achievement: 01/06/24 Potential to Achieve Goals: Fair    Frequency  Min 2X/week     Co-evaluation               AM-PAC PT 6 Clicks Mobility  Outcome Measure Help needed turning from your back to your side while in a flat bed without using bedrails?: Total Help needed moving from lying on your back to sitting on the side of a flat bed without using bedrails?: Total Help needed moving to and from a bed to a chair (including a wheelchair)?: Total Help needed standing up from a chair using your arms (e.g., wheelchair or bedside chair)?: Total Help needed to walk in hospital room?: Total Help needed climbing 3-5 steps with a railing? : Total 6 Click Score: 6    End of Session Equipment Utilized During Treatment: Gait belt Activity Tolerance: Patient limited by pain Patient left: in bed;with call bell/phone within reach;with bed alarm set;with SCD's reapplied Nurse Communication: Mobility status PT Visit Diagnosis: Muscle weakness (generalized) (M62.81);Difficulty in walking, not elsewhere classified (R26.2);Unsteadiness on feet (R26.81)    Time: 8496-8480 PT Time Calculation (min) (ACUTE ONLY): 16 min   Charges:   PT Evaluation $PT Eval Low Complexity: 1 Low   PT General Charges $$ ACUTE PT VISIT: 1 Visit  Tari KLEIN, DPT Physical Therapist Acute Rehabilitation Services Office: [phone]   [name] 12/23/2023, 4:06 PM

## 2023-12-23 NOTE — Progress Notes (Signed)
 Subjective: 2 Days Post-Op Procedure(s) (LRB): HEMIARTHROPLASTY, HIP, DIRECT ANTERIOR APPROACH, FOR FRACTURE (Right)  Unable to fully assess pain, he does endorse some pain with hip movement.  No acute events reported overnight by nursing.  Objective:   VITALS:  Temp:  [97.5 F (36.4 C)-100.2 F (37.9 C)] 99.2 F (37.3 C) (07/26 0317) Pulse Rate:  [44-77] 77 (07/26 0317) Resp:  [18-26] 18 (07/26 0317) BP: (108-144)/(65-85) 126/65 (07/26 0317) SpO2:  [84 %-100 %] 84 % (07/26 0317)  NAD ABD soft Neurovascular intact Intact pulses distally Incision: dressing C/D/I No cellulitis present Compartment soft He moved his toes this morning, does not follow commands to move feet and ankles.  Unable to fully assess motor function and sensation due to mental status, does respond to some stimuli in his feet.    LABS Recent Labs    12/20/23 1110 12/21/23 0516 12/22/23 0442  HGB 11.9* 11.0* 9.6*  WBC 7.6 7.1 8.0  PLT 158 144* 125*   Recent Labs    12/22/23 0442 12/23/23 0503  NA 137 141  K 4.3 4.4  CL 109 109  CO2 20* 24  BUN 36* 32*  CREATININE 1.10 1.01  GLUCOSE 185* 186*   No results for input(s): LABPT, INR in the last 72 hours.   Assessment/Plan: 2 Days Post-Op Procedure(s) (LRB): HEMIARTHROPLASTY, HIP, DIRECT ANTERIOR APPROACH, FOR FRACTURE (Right)  WBAT with walker DVT ppx: Aspirin , SCDs, TEDS PO pain control PT/OT: To come today.  Dispo:  - Patient under care of the medical team, disposition per their recommendation. Pain medication and DVT ppx printed in chart.    Benjamin Jackson 12/23/2023, 7:10 AM

## 2023-12-23 NOTE — Progress Notes (Signed)
   12/22/23 2102  BiPAP/CPAP/SIPAP  BiPAP/CPAP/SIPAP Pt Type Adult  Reason BIPAP/CPAP not in use Other(comment) (patient has dementia and is constantly messing with wires...cpap is contrindicated at this time)  BiPAP/CPAP /SiPAP Vitals  Resp (!) 26  MEWS Score/Color  MEWS Score 3  MEWS Score Color Yellow

## 2023-12-23 NOTE — Plan of Care (Signed)

## 2023-12-24 DIAGNOSIS — S72001A Fracture of unspecified part of neck of right femur, initial encounter for closed fracture: Secondary | ICD-10-CM | POA: Diagnosis not present

## 2023-12-24 LAB — GLUCOSE, CAPILLARY
Glucose-Capillary: 173 mg/dL — ABNORMAL HIGH (ref 70–99)
Glucose-Capillary: 289 mg/dL — ABNORMAL HIGH (ref 70–99)
Glucose-Capillary: 195 mg/dL — ABNORMAL HIGH (ref 70–99)
Glucose-Capillary: 202 mg/dL — ABNORMAL HIGH (ref 70–99)

## 2023-12-24 LAB — CBC WITH DIFFERENTIAL/PLATELET
Abs Immature Granulocytes: 0.07 K/uL (ref 0.00–0.07)
Basophils Absolute: 0 K/uL (ref 0.0–0.1)
Basophils Relative: 0 %
Eosinophils Absolute: 0.3 K/uL (ref 0.0–0.5)
Eosinophils Relative: 3 %
HCT: 31.7 % — ABNORMAL LOW (ref 39.0–52.0)
Hemoglobin: 10 g/dL — ABNORMAL LOW (ref 13.0–17.0)
Immature Granulocytes: 1 %
Lymphocytes Relative: 11 %
Lymphs Abs: 0.9 K/uL (ref 0.7–4.0)
MCH: 35.6 pg — ABNORMAL HIGH (ref 26.0–34.0)
MCHC: 31.5 g/dL (ref 30.0–36.0)
MCV: 112.8 fL — ABNORMAL HIGH (ref 80.0–100.0)
Monocytes Absolute: 0.6 K/uL (ref 0.1–1.0)
Monocytes Relative: 7 %
Neutro Abs: 6.6 K/uL (ref 1.7–7.7)
Neutrophils Relative %: 78 %
Platelets: 157 K/uL (ref 150–400)
RBC: 2.81 MIL/uL — ABNORMAL LOW (ref 4.22–5.81)
RDW: 12.9 % (ref 11.5–15.5)
WBC: 8.5 K/uL (ref 4.0–10.5)
nRBC: 0 % (ref 0.0–0.2)

## 2023-12-24 LAB — BASIC METABOLIC PANEL WITH GFR
Anion gap: 7 (ref 5–15)
BUN: 27 mg/dL — ABNORMAL HIGH (ref 8–23)
CO2: 21 mmol/L — ABNORMAL LOW (ref 22–32)
Calcium: 8.1 mg/dL — ABNORMAL LOW (ref 8.9–10.3)
Chloride: 111 mmol/L (ref 98–111)
Creatinine, Ser: 0.8 mg/dL (ref 0.61–1.24)
GFR, Estimated: >60 mL/min (ref >60)
Glucose, Bld: 192 mg/dL — ABNORMAL HIGH (ref 70–99)
Potassium: 4.2 mmol/L (ref 3.5–5.1)
Sodium: 139 mmol/L (ref 135–145)

## 2023-12-24 LAB — HEMOGLOBIN A1C
Hgb A1c MFr Bld: 7 % — ABNORMAL HIGH (ref 4.8–5.6)
Mean Plasma Glucose: 154.2 mg/dL

## 2023-12-24 NOTE — Progress Notes (Addendum)
    Subjective: 3 Days Post-Op Procedure(s) (LRB): HEMIARTHROPLASTY, HIP, DIRECT ANTERIOR APPROACH, FOR FRACTURE (Right) Patient reports pain as mild.   Denies CP or SOB.  Voiding without difficulty. Positive flatus. Patient is not the best historian due to mental status  Objective: Vital signs in last 24 hours: Temp:  [98 F (36.7 C)-98.5 F (36.9 C)] 98.5 F (36.9 C) (07/27 0415) Pulse Rate:  [71-78] 71 (07/27 0415) Resp:  [16-18] 18 (07/27 0415) BP: (132-143)/(57-62) 143/62 (07/27 0415) SpO2:  [97 %-100 %] 100 % (07/27 0415)  Intake/Output from previous day: 07/26 0701 - 07/27 0700 In: 1046 [P.O.:320; I.V.:726] Out: 800 [Urine:800] Intake/Output this shift: Total I/O In: 240 [P.O.:240] Out: -   Labs: Recent Labs    12/22/23 0442 12/23/23 0503 12/24/23 0446  HGB 9.6* 9.4* 10.0*   Recent Labs    12/23/23 0503 12/24/23 0446  WBC 9.5 8.5  RBC 2.62* 2.81*  HCT 28.8* 31.7*  PLT 173 157   Recent Labs    12/23/23 0503 12/24/23 0446  NA 141 139  K 4.4 4.2  CL 109 111  CO2 24 21*  BUN 32* 27*  CREATININE 1.01 0.80  GLUCOSE 186* 192*  CALCIUM  8.4* 8.1*   No results for input(s): LABPT, INR in the last 72 hours.  Physical Exam: Sensation intact distally Intact pulses distally Dorsiflexion/Plantar flexion intact Incision: dressing C/D/I No cellulitis present Compartment soft Body mass index is 32.91 kg/m.   Assessment/Plan: 3 Days Post-Op Procedure(s) (LRB): HEMIARTHROPLASTY, HIP, DIRECT ANTERIOR APPROACH, FOR FRACTURE (Right) Continue Care Continue pain medical management  Continue Incentive spirometry  Continue mobilization with PT Continue to ice surgical site WBAT ASA for DVT prophylaxis Aquacel in place HGB today was 10.0 Patient remains under hospitalist care  Plan to d/c to SNF  Letticia Bhattacharyya LOISE Sages for Dr. Norleen Armor [facility] [phone] 12/24/2023, 9:30 AM

## 2023-12-24 NOTE — Plan of Care (Signed)
  Problem: Education: Goal: Knowledge of General Education information will improve Description: Including pain rating scale, medication(s)/side effects and non-pharmacologic comfort measures Outcome: Progressing   Problem: Health Behavior/Discharge Planning: Goal: Ability to manage health-related needs will improve Outcome: Progressing   Problem: Clinical Measurements: Goal: Ability to maintain clinical measurements within normal limits will improve Outcome: Progressing Goal: Will remain free from infection Outcome: Progressing Goal: Diagnostic test results will improve Outcome: Progressing Goal: Respiratory complications will improve Outcome: Progressing Goal: Cardiovascular complication will be avoided Outcome: Progressing   Problem: Activity: Goal: Risk for activity intolerance will decrease Outcome: Progressing   Problem: Nutrition: Goal: Adequate nutrition will be maintained Outcome: Progressing   Problem: Coping: Goal: Level of anxiety will decrease Outcome: Progressing   Problem: Elimination: Goal: Will not experience complications related to bowel motility Outcome: Progressing Goal: Will not experience complications related to urinary retention Outcome: Progressing   Problem: Pain Managment: Goal: General experience of comfort will improve and/or be controlled Outcome: Progressing   Problem: Safety: Goal: Ability to remain free from injury will improve Outcome: Progressing   Problem: Skin Integrity: Goal: Risk for impaired skin integrity will decrease Outcome: Progressing   Problem: Education: Goal: Verbalization of understanding the information provided (i.e., activity precautions, restrictions, etc) will improve Outcome: Progressing Goal: Individualized Educational Video(s) Outcome: Progressing   Problem: Activity: Goal: Ability to ambulate and perform ADLs will improve Outcome: Progressing   Problem: Clinical Measurements: Goal: Postoperative  complications will be avoided or minimized Outcome: Progressing   Problem: Self-Concept: Goal: Ability to maintain and perform role responsibilities to the fullest extent possible will improve Outcome: Progressing   Problem: Pain Management: Goal: Pain level will decrease Outcome: Progressing   Problem: Education: Goal: Ability to describe self-care measures that may prevent or decrease complications (Diabetes Survival Skills Education) will improve Outcome: Progressing Goal: Individualized Educational Video(s) Outcome: Progressing   Problem: Coping: Goal: Ability to adjust to condition or change in health will improve Outcome: Progressing   Problem: Fluid Volume: Goal: Ability to maintain a balanced intake and output will improve Outcome: Progressing   Problem: Health Behavior/Discharge Planning: Goal: Ability to identify and utilize available resources and services will improve Outcome: Progressing Goal: Ability to manage health-related needs will improve Outcome: Progressing   Problem: Metabolic: Goal: Ability to maintain appropriate glucose levels will improve Outcome: Progressing   Problem: Nutritional: Goal: Maintenance of adequate nutrition will improve Outcome: Progressing Goal: Progress toward achieving an optimal weight will improve Outcome: Progressing   Problem: Skin Integrity: Goal: Risk for impaired skin integrity will decrease Outcome: Progressing   Problem: Tissue Perfusion: Goal: Adequacy of tissue perfusion will improve Outcome: Progressing

## 2023-12-24 NOTE — Progress Notes (Signed)
 Progress Note   Patient: Benjamin Jackson DOB: [birth year] DOA: 12/20/2023     4 DOS: the patient was seen and examined on 12/24/2023   Brief Narrative: Benjamin Jackson is an 89 y.o. male with a history of T2DM, HTN, HLD, stage IIIa CKD, OSA, Alzheimer's dementia, obesity, vitamin D deficiency, osteoporosis, GERD, depression who presented to the ED on 12/20/2023 after a fall at his facility with right hip pain confirmed to have a right femoral neck fracture with valgus angulation. He was admitted, ultimately underwent anterior-approach right hemiarthroplasty 7/24.  PT OT working with patient and has recommended SNF   Assessment & Plan: Closed displaced right hip fracture: s/p right THA, anterior approach by Dr. Fidel 7/24.  - Continue routine postoperative care, orthopedics following.  - PT/OT on board and have recommended skilled nursing facility [facility] on board and working on this Continue analgesia (with bowel regimen) and VTE ppx per orthopedics.     Dysphagia:  There was some complaint of patient pocketing his pills Patient was seen by speech therapist with recommendation of regular diet with thin liquids   Alzheimer's dementia: Resides at [facility]  - Delirium precautions. Ideally would return to familiar environment at discharge.  - Continue olanzapine  BID - Continue rivastigmine  patch   CAD, HLD: No chest pain reported.  - Continue statin. Full dose daily aspirin  for VTE ppx per orthopedics, after this duration, plan to return to baseline 81mg  dosing.    T2DM:  - Continue SSI, hold home metformin for now.    HTN:  - prn hydralazine    GERD:  Continue PPI therapy   Postoperative blood loss anemia on chronic iron deficiency anemia:  - Monitor H&H, continue po iron   OSA:  - CPAP qHS       Family Communication: None at bedside   Disposition: Pending SNF  Status is: Inpatient   Time spent: 40 minutes   Subjective:  Patient still confused  however able to communicate and denies any acute complaints No acute overnight event reported   Physical Exam: Gen: Elderly male laying in bed confused Pulm: Clear, nonlabored, diminished  CV: RRR, no MRG GI: Soft, NT, ND, +BS  Neuro: Alert and disoriented, not cooperative with most commands. Does resist movement in R foot with good strength, withdraws to nailbed pressure as well. Ext: Warm, dry Skin: R anterolateral hip incision dressing clean and dry   Data Reviewed:    Latest Ref Rng & Units 12/24/2023    4:46 AM 12/23/2023    5:03 AM 12/22/2023    4:42 AM  CBC  WBC 4.0 - 10.5 K/uL 8.5  9.5  8.0   Hemoglobin 13.0 - 17.0 g/dL 89.9  9.4  9.6   Hematocrit 39.0 - 52.0 % 31.7  28.8  31.0   Platelets 150 - 400 K/uL 157  173  125        Latest Ref Rng & Units 12/24/2023    4:46 AM 12/23/2023    5:03 AM 12/22/2023    4:42 AM  BMP  Glucose 70 - 99 mg/dL 807  813  814   BUN 8 - 23 mg/dL 27  32  36   Creatinine 0.61 - 1.24 mg/dL 9.19  8.98  8.89   Sodium 135 - 145 mmol/L 139  141  137   Potassium 3.5 - 5.1 mmol/L 4.2  4.4  4.3   Chloride 98 - 111 mmol/L 111  109  109   CO2 22 -  32 mmol/L 21  24  20    Calcium  8.9 - 10.3 mg/dL 8.1  8.4  7.9      Vitals:   12/23/23 0317 12/23/23 2030 12/24/23 0415 12/24/23 1155  BP: 126/65 (!) 132/57 (!) 143/62 (!) 120/58  Pulse: 77 78 71 80  Resp: 18 16 18 20   Temp: 99.2 F (37.3 C) 98 F (36.7 C) 98.5 F (36.9 C) 98.7 F (37.1 C)  TempSrc: Oral Oral  Oral  SpO2: (!) 84% 97% 100% 98%  Weight:      Height:         Author: Drue ONEIDA Potter, MD 12/24/2023 4:50 PM  For on call review [email or web address].

## 2023-12-25 DIAGNOSIS — S72001A Fracture of unspecified part of neck of right femur, initial encounter for closed fracture: Secondary | ICD-10-CM | POA: Diagnosis not present

## 2023-12-25 LAB — BASIC METABOLIC PANEL WITH GFR
Anion gap: 7 (ref 5–15)
BUN: 28 mg/dL — ABNORMAL HIGH (ref 8–23)
CO2: 24 mmol/L (ref 22–32)
Calcium: 8.3 mg/dL — ABNORMAL LOW (ref 8.9–10.3)
Chloride: 106 mmol/L (ref 98–111)
Creatinine, Ser: 1.01 mg/dL (ref 0.61–1.24)
GFR, Estimated: >60 mL/min (ref >60)
Glucose, Bld: 170 mg/dL — ABNORMAL HIGH (ref 70–99)
Potassium: 4.2 mmol/L (ref 3.5–5.1)
Sodium: 137 mmol/L (ref 135–145)

## 2023-12-25 LAB — CBC WITH DIFFERENTIAL/PLATELET
Abs Immature Granulocytes: 0.13 K/uL — ABNORMAL HIGH (ref 0.00–0.07)
Basophils Absolute: 0 K/uL (ref 0.0–0.1)
Basophils Relative: 0 %
Eosinophils Absolute: 0.3 K/uL (ref 0.0–0.5)
Eosinophils Relative: 4 %
HCT: 30.2 % — ABNORMAL LOW (ref 39.0–52.0)
Hemoglobin: 9.5 g/dL — ABNORMAL LOW (ref 13.0–17.0)
Immature Granulocytes: 2 %
Lymphocytes Relative: 11 %
Lymphs Abs: 0.9 K/uL (ref 0.7–4.0)
MCH: 35.7 pg — ABNORMAL HIGH (ref 26.0–34.0)
MCHC: 31.5 g/dL (ref 30.0–36.0)
MCV: 113.5 fL — ABNORMAL HIGH (ref 80.0–100.0)
Monocytes Absolute: 0.8 K/uL (ref 0.1–1.0)
Monocytes Relative: 9 %
Neutro Abs: 6.1 K/uL (ref 1.7–7.7)
Neutrophils Relative %: 74 %
Platelets: 165 K/uL (ref 150–400)
RBC: 2.66 MIL/uL — ABNORMAL LOW (ref 4.22–5.81)
RDW: 13.2 % (ref 11.5–15.5)
WBC: 8.3 K/uL (ref 4.0–10.5)
nRBC: 0 % (ref 0.0–0.2)

## 2023-12-25 LAB — GLUCOSE, CAPILLARY
Glucose-Capillary: 179 mg/dL — ABNORMAL HIGH (ref 70–99)
Glucose-Capillary: 183 mg/dL — ABNORMAL HIGH (ref 70–99)
Glucose-Capillary: 204 mg/dL — ABNORMAL HIGH (ref 70–99)
Glucose-Capillary: 273 mg/dL — ABNORMAL HIGH (ref 70–99)

## 2023-12-25 MED ORDER — POLYETHYLENE GLYCOL 3350 17 G PO PACK
17.0000 g | PACK | Freq: Two times a day (BID) | ORAL | Status: DC
  Administered 2023-12-25 – 2023-12-27 (×5): 17 g via ORAL
  Filled 2023-12-25 (×4): qty 1

## 2023-12-25 NOTE — TOC Progression Note (Signed)
 Transition of Care [facility]) - Progression Note    Patient Details  Name: LORENE SAMAAN MRN: [id number] Date of Birth: [birth year]  [facility] CM/SW Contact  Thresa Dozier, Nathanel, RN Phone Number: 12/25/2023, 10:59 AM  Clinical Narrative:PT recc ST SNF-faxed out await bed offers, choice & level 2 pasrr prior auth.       Expected Discharge Plan: Skilled Nursing Facility Barriers to Discharge: Continued Medical Work up               Expected Discharge Plan and Services   Discharge Planning Services: CM Consult Post Acute Care Choice: Skilled Nursing Facility Living arrangements for the past 2 months: Post-Acute Facility (memory care)                                       Social Drivers of Health (SDOH) Interventions SDOH Screenings   Food Insecurity: No Food Insecurity (12/20/2023)  Housing: Low Risk  (12/20/2023)  Transportation Needs: No Transportation Needs (12/20/2023)  Utilities: Not At Risk (12/20/2023)  Social Connections: Patient Unable To Answer (12/20/2023)  Tobacco Use: Unknown (12/20/2023)    Readmission Risk Interventions    12/21/2023    2:10 PM  Readmission Risk Prevention Plan  Transportation Screening Complete  PCP or Specialist Appt within 3-5 Days Complete  HRI or Home Care Consult Complete  Palliative Care Screening Not Applicable

## 2023-12-25 NOTE — Progress Notes (Signed)
   12/25/23 1948  BiPAP/CPAP/SIPAP  BiPAP/CPAP/SIPAP Pt Type Adult  Reason BIPAP/CPAP not in use Other(comment) (contraindicated)

## 2023-12-25 NOTE — Progress Notes (Signed)
 Progress Note   Patient: Benjamin Jackson FMW:[id number] DOB: Jun 25, 1933 DOA: 12/20/2023     5 DOS: the patient was seen and examined on 12/25/2023       Brief Narrative: Benjamin Jackson is an 89 y.o. male with a history of T2DM, HTN, HLD, stage IIIa CKD, OSA, Alzheimer's dementia, obesity, vitamin D deficiency, osteoporosis, GERD, depression who presented to the ED on 12/20/2023 after a fall at his facility with right hip pain confirmed to have a right femoral neck fracture with valgus angulation. He was admitted, ultimately underwent anterior-approach right hemiarthroplasty 7/24.  PT OT working with patient and has recommended SNF   Assessment & Plan: Closed displaced right hip fracture: s/p right THA, anterior approach by Dr. Fidel 7/24.  - Continue routine postoperative care, orthopedics following.  - PT/OT on board and have recommended skilled nursing facility [facility] on board and working on this Continue analgesia (with bowel regimen) and VTE ppx per orthopedics.     Dysphagia:  There was some complaint of patient pocketing his pills Patient was seen by speech therapist with recommendation of regular diet with thin liquids   Alzheimer's dementia: Resides at [facility].  - Delirium precautions. Ideally would return to familiar environment at discharge.  - Continue olanzapine  BID - Continue rivastigmine  patch   CAD, HLD: No chest pain reported.  - Continue statin. Full dose daily aspirin  for VTE ppx per orthopedics, after this duration, plan to return to baseline 81mg  dosing.    T2DM:  - Continue SSI, hold home metformin for now.    HTN:  - prn hydralazine    GERD:  Continue PPI therapy   Postoperative blood loss anemia on chronic iron deficiency anemia:  - Monitor H&H, continue po iron   OSA:  - CPAP qHS       Family Communication: None at bedside   Disposition: Pending SNF   Status is: Inpatient   Time spent: 40 minutes   Subjective:  Patient still  confused however able to communicate  Denies any acute overnight event Denies nausea vomiting abdominal pain   Physical Exam: Gen: Elderly male laying in bed confused Pulm: Clear, nonlabored, diminished  CV: RRR, no MRG GI: Soft, NT, ND, +BS  Neuro: Alert and disoriented, not cooperative with most commands. Does resist movement in R foot with good strength, withdraws to nailbed pressure as well. Ext: Warm, dry Skin: R anterolateral hip incision dressing clean and dry   Data Reviewed:    Latest Ref Rng & Units 12/25/2023    4:49 AM 12/24/2023    4:46 AM 12/23/2023    5:03 AM  CBC  WBC 4.0 - 10.5 K/uL 8.3  8.5  9.5   Hemoglobin 13.0 - 17.0 g/dL 9.5  89.9  9.4   Hematocrit 39.0 - 52.0 % 30.2  31.7  28.8   Platelets 150 - 400 K/uL 165  157  173        Latest Ref Rng & Units 12/25/2023    4:49 AM 12/24/2023    4:46 AM 12/23/2023    5:03 AM  BMP  Glucose 70 - 99 mg/dL 829  807  813   BUN 8 - 23 mg/dL 28  27  32   Creatinine 0.61 - 1.24 mg/dL 8.98  9.19  8.98   Sodium 135 - 145 mmol/L 137  139  141   Potassium 3.5 - 5.1 mmol/L 4.2  4.2  4.4   Chloride 98 - 111 mmol/L 106  111  109   CO2 22 - 32 mmol/L 24  21  24    Calcium  8.9 - 10.3 mg/dL 8.3  8.1  8.4     Vitals:   12/24/23 1155 12/24/23 1932 12/25/23 0335 12/25/23 1303  BP: (!) 120/58 124/73 (!) 158/96 133/68  Pulse: 80 84 73 80  Resp: 20 15 19 18   Temp: 98.7 F (37.1 C) 100.2 F (37.9 C) 98.7 F (37.1 C) 99 F (37.2 C)  TempSrc: Oral Oral Oral Oral  SpO2: 98% 98% 99% 95%  Weight:      Height:        Author: Drue ONEIDA Potter, MD 12/25/2023 4:40 PM  For on call review [email or web address].

## 2023-12-25 NOTE — TOC PASRR Note (Signed)
 30 Day PASRR Note   Patient Details  Name: Benjamin Jackson Date of Birth: 05/23/1934   Transition of Care [facility]) CM/SW Contact:    Bascom Service, RN Phone Number: 12/25/2023, 10:56 AM  To Whom It May Concern:  Please be advised that this patient will require a short-term nursing home stay - anticipated 30 days or less for rehabilitation and strengthening.   The plan is for return home.

## 2023-12-25 NOTE — NC FL2 (Signed)
 [place]  MEDICAID FL2 LEVEL OF CARE FORM     IDENTIFICATION  Patient Name: Benjamin Jackson Birthdate: 1933-07-24 Sex: male Admission Date (Current Location): 12/20/2023  [employer] and [place] Number:  Producer, television/film/video and Address:  [facility],  [address] [place], [place] [address]      Provider Number: [id number]  Attending Physician Name and Address:  Dorinda Drue DASEN, MD  Relative Name and Phone Number:  Horrace Hanak [phone]    Current Level of Care: Hospital Recommended Level of Care: Skilled Nursing Facility Prior Approval Number:    Date Approved/Denied:   PASRR Number: [id number]  Discharge Plan: SNF    Current Diagnoses: Patient Active Problem List   Diagnosis Date Noted   Gastroesophageal reflux disease without esophagitis 12/20/2023   Depressive disorder 12/20/2023   Osteoporosis 12/20/2023   Stage 3 chronic kidney disease (HCC) 12/20/2023   Closed left hip fracture (HCC) 12/20/2023   Closed right hip fracture, initial encounter (HCC) 12/20/2023   Type II diabetes mellitus (HCC) 12/20/2023   Type 2 diabetes mellitus with hyperglycemia (HCC) 12/20/2023   Late onset Alzheimer's dementia without behavioral disturbance (HCC) 10/27/2020   Essential hypertension 03/02/2016   Type 2 diabetes mellitus with circulatory disorder (HCC) 03/02/2016   Coronary artery disease 03/01/2016   Hypokalemia 02/29/2016   Mild intermittent asthma without complication 08/17/2015   Hyperlipidemia 08/03/2015   Hydronephrosis, right 06/05/2015   Iron deficiency anemia 06/05/2015   Obesity 06/05/2015   Obstructive sleep apnea syndrome 06/05/2015   Peripheral neuropathy 06/05/2015   Vitamin D deficiency 06/05/2015   Fall 03/30/2013   Pelvic contusion 03/30/2013    Orientation RESPIRATION BLADDER Height & Weight     Self  Normal Incontinent Weight: 95.3 kg Height:  5' 7 (170.2 cm)  BEHAVIORAL SYMPTOMS/MOOD NEUROLOGICAL BOWEL NUTRITION STATUS       Incontinent Diet (CHO MOD)  AMBULATORY STATUS COMMUNICATION OF NEEDS Skin   Extensive Assist Verbally Normal                       Personal Care Assistance Level of Assistance  Bathing, Feeding, Dressing Bathing Assistance: Maximum assistance Feeding assistance: Limited assistance Dressing Assistance: Maximum assistance     Functional Limitations Info  Sight, Hearing, Speech Sight Info: Impaired (eyeglasses) Hearing Info: Adequate Speech Info: Adequate    SPECIAL CARE FACTORS FREQUENCY  PT (By licensed PT), OT (By licensed OT)     PT Frequency: 5x week OT Frequency: 5x week            Contractures Contractures Info: Not present    Additional Factors Info  Code Status, Allergies, Psychotropic Code Status Info: DNR Allergies Info: Xylocaine (Lidocaine Hcl), Roxicodone (Oxycodone), Zestril (Lisinopril), Zofran (Ondansetron Hcl), Wellbutrin (Bupropion) Psychotropic Info: Zyprexa          Current Medications (12/25/2023):  This is the current hospital active medication list Current Facility-Administered Medications  Medication Dose Route Frequency Provider Last Rate Last Admin   acetaminophen  (TYLENOL ) tablet 650 mg  650 mg Oral Q6H PRN Swinteck, Redell, MD       Or   acetaminophen  (TYLENOL ) suppository 650 mg  650 mg Rectal Q6H PRN Swinteck, Redell, MD       albuterol  (PROVENTIL ) (2.5 MG/3ML) 0.083% nebulizer solution 2.5 mg  2.5 mg Inhalation Q6H PRN Fidel Redell, MD       aspirin  EC tablet 325 mg  325 mg Oral Q breakfast Swinteck, Brian, MD   325 mg at 12/25/23 [id number]  atorvastatin  (LIPITOR) tablet 10 mg  10 mg Oral Daily Swinteck, Redell, MD   10 mg at 12/24/23 9087   cyanocobalamin  (VITAMIN B12) tablet 1,000 mcg  1,000 mcg Oral Daily Fidel Redell, MD   1,000 mcg at 12/24/23 0914   docusate sodium  (COLACE) capsule 100 mg  100 mg Oral BID Fidel Redell, MD   100 mg at 12/24/23 0914   DULoxetine  (CYMBALTA ) DR capsule 30 mg  30 mg Oral Daily Swinteck, Redell, MD    30 mg at 12/24/23 9090   feeding supplement (ENSURE PLUS HIGH PROTEIN) liquid 237 mL  237 mL Oral BID BM Swinteck, Redell, MD   237 mL at 12/24/23 1419   hydrALAZINE  (APRESOLINE ) injection 10 mg  10 mg Intravenous Q6H PRN Swinteck, Redell, MD       HYDROcodone -acetaminophen  (NORCO/VICODIN) 5-325 MG per tablet 0.5 tablet  0.5 tablet Oral Q4H PRN Fidel Redell, MD   0.5 tablet at 12/24/23 1515   HYDROmorphone  (DILAUDID ) injection 0.5 mg  0.5 mg Intravenous Q2H PRN Fidel Redell, MD   0.5 mg at 12/24/23 2226   hydrOXYzine  (ATARAX ) tablet 12.5 mg  12.5 mg Oral Q8H PRN Swinteck, Redell, MD       insulin  aspart (novoLOG ) injection 0-5 Units  0-5 Units Subcutaneous QHS Dorinda Homans T, MD   2 Units at 12/24/23 2226   insulin  aspart (novoLOG ) injection 0-9 Units  0-9 Units Subcutaneous TID WC Dorinda Homans DASEN, MD   2 Units at 12/25/23 0846   lactulose  (CHRONULAC ) 10 GM/15ML solution 20 g  20 g Oral Daily PRN Fidel Redell, MD       menthol -cetylpyridinium (CEPACOL) lozenge 3 mg  1 lozenge Oral PRN Swinteck, Redell, MD       Or   phenol (CHLORASEPTIC) mouth spray 1 spray  1 spray Mouth/Throat PRN Swinteck, Redell, MD       methocarbamol  (ROBAXIN ) tablet 500 mg  500 mg Oral Q6H PRN Swinteck, Redell, MD       Or   methocarbamol  (ROBAXIN ) injection 500 mg  500 mg Intravenous Q6H PRN Swinteck, Redell, MD       metoCLOPramide  (REGLAN ) tablet 5-10 mg  5-10 mg Oral Q8H PRN Swinteck, Redell, MD       Or   metoCLOPramide  (REGLAN ) injection 5-10 mg  5-10 mg Intravenous Q8H PRN Swinteck, Redell, MD       montelukast  (SINGULAIR ) tablet 10 mg  10 mg Oral QHS Swinteck, Redell, MD   10 mg at 12/24/23 2223   mupirocin  ointment (BACTROBAN ) 2 % 1 Application  1 Application Nasal BID Fidel Redell, MD   1 Application at 12/24/23 2244   naloxone  (NARCAN ) injection 0.4 mg  0.4 mg Intravenous PRN Fidel Redell, MD       OLANZapine  (ZYPREXA ) tablet 5 mg  5 mg Oral BID Fidel Redell, MD   5 mg at 12/24/23 2223   pantoprazole   (PROTONIX ) EC tablet 40 mg  40 mg Oral Daily Swinteck, Redell, MD   40 mg at 12/24/23 0910   polyethylene glycol powder (GLYCOLAX /MIRALAX ) container 17 g  17 g Oral BID Fidel Redell, MD       prochlorperazine  (COMPAZINE ) injection 10 mg  10 mg Intravenous Q6H PRN Swinteck, Redell, MD       rivastigmine  (EXELON ) 9.5 mg/24hr 9.5 mg  9.5 mg Transdermal q AM Swinteck, Brian, MD   9.5 mg at 12/25/23 0614   senna (SENOKOT) tablet 8.6 mg  1 tablet Oral BID Fidel Redell, MD   8.6 mg  at 12/24/23 2223     Discharge Medications: Please see discharge summary for a list of discharge medications.  Relevant Imaging Results:  Relevant Lab Results:   Additional Information SS#[phone], [place], [place]

## 2023-12-25 NOTE — Plan of Care (Signed)
  Problem: Nutrition: Goal: Adequate nutrition will be maintained Outcome: Progressing   Problem: Education: Goal: Knowledge of General Education information will improve Description: Including pain rating scale, medication(s)/side effects and non-pharmacologic comfort measures Outcome: Not Progressing   Problem: Health Behavior/Discharge Planning: Goal: Ability to manage health-related needs will improve Outcome: Not Progressing   Problem: Coping: Goal: Level of anxiety will decrease Outcome: Not Progressing   Problem: Education: Goal: Verbalization of understanding the information provided (i.e., activity precautions, restrictions, etc) will improve Outcome: Not Progressing

## 2023-12-25 NOTE — Progress Notes (Signed)
 Speech Language Pathology Treatment: Dysphagia  Patient Details Name: Benjamin Jackson MRN: [id number] DOB: 1933/09/22 Today's Date: 12/25/2023 Time: 8873-8844 SLP Time Calculation (min) (ACUTE ONLY): 29 min  Assessment / Plan / Recommendation Clinical Impression  Patient seen by SLP for skilled treatment session focused on dysphagia goals. SLP assisted patient with lunch meal tray setup but he was able to feed self using utensils and hands. He tolerated mechanical soft solids without significant difficulty but when trying to eat regular solids, he would hold food in his mouth without adequate attempts to masticate and eventually required cue to spit out. He didn't start exhibiting any overt s/s aspiration until he was eating grapes, during which he would exhibit frequent coughing. SLP spoke with patient's RN regarding least restrictive diet and both in agreement to downgrade to mechanical soft solids (dys 3). SLP will continue to follow.   HPI HPI: Benjamin Jackson is a 89 y.o. male with past medical history significant of type II DM, hypertension, depression, hyperlipidemia, right hydronephrosis, GERD, late onset Alzheimer's, mild asthma, class I obesity, obstructive sleep apnea, osteoporosis, peripheral neuropathy, stage III CKD, type 2 diabetes, vitamin D deficiency admitted from memory care facility with right femoral neck fracture s/p repair on 7/24.      SLP Plan  Continue with current plan of care          Recommendations  Diet recommendations: Dysphagia 3 (mechanical soft);Thin liquid Liquids provided via: Cup;Straw Medication Administration: Other (Comment) (whole or crushed) Supervision: Patient able to self feed;Staff to assist with self feeding;Full supervision/cueing for compensatory strategies Compensations: Slow rate;Small sips/bites;Minimize environmental distractions Postural Changes and/or Swallow Maneuvers: Seated upright 90 degrees                  Oral care BID    Frequent or constant Supervision/Assistance Dysphagia, unspecified (R13.10)     Continue with current plan of care     Norleen IVAR Blase, MA, CCC-SLP Speech Therapy

## 2023-12-25 NOTE — Progress Notes (Signed)
 Physical Therapy Treatment Patient Details Name: Benjamin Jackson MRN: [id number] DOB: 05/26/1934 Today's Date: 12/25/2023   History of Present Illness [name] is a 89 yr old male who had a fall and sustained a displaced R femoral neck fracture. He is s/p Right hip hemiarthroplasty, anterior approach on 12-21-23. PMH: CKD III, DM II, HTN, HLD, OSA, Alzheimer's dementia, obesity, vitamin D deficiency, osteoporosis, GERD, depression    PT Comments  Pt assisted with sitting EOB and attempted standing twice.  Pt currently continues to require +2 max-total assist to mobilize.  Pt also moans/calls out in pain with movement however appears better and more at ease once still and resting.  Pt very pleasant and thankful upon repositioning in supine.  Patient will benefit from continued inpatient follow up therapy, <3 hours/day.      If plan is discharge home, recommend the following: Two people to help with walking and/or transfers;Two people to help with bathing/dressing/bathroom   Can travel by private vehicle     No  Equipment Recommendations  Other (comment) (per next venue)    Recommendations for Other Services       Precautions / Restrictions Precautions Precautions: Fall Precaution/Restrictions Comments: HOH, only oriented to self Restrictions RLE Weight Bearing Per Provider Order: Weight bearing as tolerated     Mobility  Bed Mobility Overal bed mobility: Needs Assistance Bed Mobility: Supine to Sit, Sit to Supine     Supine to sit: Total assist, +2 for physical assistance Sit to supine: Total assist, +2 for physical assistance   General bed mobility comments: reliant on total +2 assist, moans in pain    Transfers Overall transfer level: Needs assistance Equipment used: Rolling walker (2 wheels) Transfers: Sit to/from Stand Sit to Stand: Max assist, +2 physical assistance, Total assist           General transfer comment: multimodal cues for positioning; assist to rise  and stabilize, pt unable to maintain standing without significant assist, also moaning in pain so assisted safely back to sitting; attempted x2 however pt unable to assist; pt has good grip on RW however attempts to lift RW upon standing (pt calling out in pain with assist but appears at ease once resting again)    Ambulation/Gait                   Stairs             Wheelchair Mobility     Tilt Bed    Modified Rankin (Stroke Patients Only)       Balance Overall balance assessment: Needs assistance Sitting-balance support: Feet supported, Bilateral upper extremity supported Sitting balance-Leahy Scale: Poor Sitting balance - Comments: once pt assisted to sitting EOB, pt able to maintain upright posture with self UE support, no external physical support required   Standing balance support: Bilateral upper extremity supported, Reliant on assistive device for balance Standing balance-Leahy Scale: Zero Standing balance comment: extensive assist required                            Communication Communication Communication: Impaired Factors Affecting Communication: Hearing impaired  Cognition Arousal: Alert     PT - Cognitive impairments: History of cognitive impairments                       PT - Cognition Comments: hx dementia, oriented to self only Following commands: Impaired Following commands impaired: Follows one step  commands inconsistently, Follows one step commands with increased time    Cueing Cueing Techniques: Verbal cues, Gestural cues, Tactile cues  Exercises      General Comments        Pertinent Vitals/Pain Pain Assessment Pain Assessment: Faces Faces Pain Scale: Hurts whole lot Pain Location: RLE with movement Pain Descriptors / Indicators: Grimacing, Guarding, Moaning Pain Intervention(s): Repositioned, Monitored during session    Home Living                          Prior Function             PT Goals (current goals can now be found in the care plan section) Progress towards PT goals: Progressing toward goals    Frequency    Min 2X/week      PT Plan      Co-evaluation              AM-PAC PT 6 Clicks Mobility   Outcome Measure  Help needed turning from your back to your side while in a flat bed without using bedrails?: Total Help needed moving from lying on your back to sitting on the side of a flat bed without using bedrails?: Total Help needed moving to and from a bed to a chair (including a wheelchair)?: Total Help needed standing up from a chair using your arms (e.g., wheelchair or bedside chair)?: Total Help needed to walk in hospital room?: Total Help needed climbing 3-5 steps with a railing? : Total 6 Click Score: 6    End of Session Equipment Utilized During Treatment: Gait belt Activity Tolerance: Patient limited by pain Patient left: in bed;with call bell/phone within reach;with bed alarm set   PT Visit Diagnosis: Muscle weakness (generalized) (M62.81);Difficulty in walking, not elsewhere classified (R26.2);Unsteadiness on feet (R26.81)     Time: 8599-8585 PT Time Calculation (min) (ACUTE ONLY): 14 min  Charges:    $Therapeutic Activity: 8-22 mins PT General Charges $$ ACUTE PT VISIT: 1 Visit                    Tari PT, DPT Physical Therapist Acute Rehabilitation Services Office: [phone]  [name] 12/25/2023, 4:18 PM

## 2023-12-26 DIAGNOSIS — S72001A Fracture of unspecified part of neck of right femur, initial encounter for closed fracture: Secondary | ICD-10-CM | POA: Diagnosis not present

## 2023-12-26 LAB — CBC WITH DIFFERENTIAL/PLATELET
Abs Immature Granulocytes: 0.07 K/uL (ref 0.00–0.07)
Basophils Absolute: 0 K/uL (ref 0.0–0.1)
Basophils Relative: 0 %
Eosinophils Absolute: 0.2 K/uL (ref 0.0–0.5)
Eosinophils Relative: 3 %
HCT: 33.3 % — ABNORMAL LOW (ref 39.0–52.0)
Hemoglobin: 10.6 g/dL — ABNORMAL LOW (ref 13.0–17.0)
Immature Granulocytes: 1 %
Lymphocytes Relative: 10 %
Lymphs Abs: 0.7 K/uL (ref 0.7–4.0)
MCH: 35 pg — ABNORMAL HIGH (ref 26.0–34.0)
MCHC: 31.8 g/dL (ref 30.0–36.0)
MCV: 109.9 fL — ABNORMAL HIGH (ref 80.0–100.0)
Monocytes Absolute: 0.6 K/uL (ref 0.1–1.0)
Monocytes Relative: 8 %
Neutro Abs: 5.8 K/uL (ref 1.7–7.7)
Neutrophils Relative %: 78 %
Platelets: 156 K/uL (ref 150–400)
RBC: 3.03 MIL/uL — ABNORMAL LOW (ref 4.22–5.81)
RDW: 12.9 % (ref 11.5–15.5)
WBC: 7.4 K/uL (ref 4.0–10.5)
nRBC: 0 % (ref 0.0–0.2)

## 2023-12-26 LAB — GLUCOSE, CAPILLARY
Glucose-Capillary: 221 mg/dL — ABNORMAL HIGH (ref 70–99)
Glucose-Capillary: 166 mg/dL — ABNORMAL HIGH (ref 70–99)
Glucose-Capillary: 156 mg/dL — ABNORMAL HIGH (ref 70–99)

## 2023-12-26 LAB — BASIC METABOLIC PANEL WITH GFR
Anion gap: 9 (ref 5–15)
BUN: 28 mg/dL — ABNORMAL HIGH (ref 8–23)
CO2: 22 mmol/L (ref 22–32)
Calcium: 8.3 mg/dL — ABNORMAL LOW (ref 8.9–10.3)
Chloride: 102 mmol/L (ref 98–111)
Creatinine, Ser: 0.91 mg/dL (ref 0.61–1.24)
GFR, Estimated: >60 mL/min (ref >60)
Glucose, Bld: 187 mg/dL — ABNORMAL HIGH (ref 70–99)
Potassium: 4.6 mmol/L (ref 3.5–5.1)
Sodium: 133 mmol/L — ABNORMAL LOW (ref 135–145)

## 2023-12-26 NOTE — TOC Progression Note (Addendum)
 Transition of Care [facility]) - Progression Note    Patient Details  Name: MATTHEO SWINDLE MRN: [id number] Date of Birth: [birth year]  Transition of Care [facility]) CM/SW Contact  Dezzie Badilla, [place], RN Phone Number: 12/26/2023, 9:17 AM  Clinical Narrative:   MD to co sign the updated fl2 with new pasrr#, will give bed offers,await choice prior auth.  Service Provider Request Status Stars Address Phone   [facility] Preferred SNF  Accepted 3 [address] [place] [address] [phone]  [facility] SNF  Accepted 1 [address] [place] [address] [phone]  [facility] Preferred [facility]  Accepted 4 [address] [place] [address] [phone]  [facility] SN[facility]  Accepted 1 [address], [place] [address] [phone]  [facility]  Accepted 1 [address] [place] [address] [phone]  [facility] SNF  Accepted 1 [address], [place] [address] [phone]  [facility]  Accepted 1 [address] [place] [address] [phone]  [facility] Preferred SNF  Accepted 2 [address], [place]  [address] [phone]   -4:15p received shara for [employer] choice from spouse Patricia-rep Nikki aware, & accepted. MD updated.  Expected Discharge Plan: Skilled Nursing Facility Barriers to Discharge: Continued Medical Work up               Expected Discharge Plan and Services   Discharge Planning Services: CM Consult Post Acute Care Choice: Skilled Nursing Facility Living arrangements for the past 2 months: Post-Acute Facility (memory care)                                       Social Drivers of Health (SDOH) Interventions SDOH Screenings   Food Insecurity: No Food Insecurity (12/20/2023)  Housing: Low Risk  (12/20/2023)  Transportation Needs: No Transportation Needs (12/20/2023)  Utilities: Not At Risk  (12/20/2023)  Social Connections: Patient Unable To Answer (12/20/2023)  Tobacco Use: Unknown (12/20/2023)    Readmission Risk Interventions    12/21/2023    2:10 PM  Readmission Risk Prevention Plan  Transportation Screening Complete  PCP or Specialist Appt within 3-5 Days Complete  HRI or Home Care Consult Complete  Palliative Care Screening Not Applicable

## 2023-12-26 NOTE — Progress Notes (Signed)
   12/26/23 2000  BiPAP/CPAP/SIPAP  BiPAP/CPAP/SIPAP Pt Type Adult  Reason BIPAP/CPAP not in use Other(comment) (contraindicated)

## 2023-12-26 NOTE — NC FL2 (Signed)
 [place]  MEDICAID FL2 LEVEL OF CARE FORM     IDENTIFICATION  Patient Name: Benjamin Jackson Birthdate: 08-25-1933 Sex: male Admission Date (Current Location): 12/20/2023  [facility] and [place] Number:  Producer, television/film/video and Address:  [facility],  [address] [place], [place] [address]      Provider Number: [id number]  Attending Physician Name and Address:  Dorinda Drue DASEN, MD  Relative Name and Phone Number:  Damaria Stofko [phone]    Current Level of Care: Hospital Recommended Level of Care: Skilled Nursing Facility Prior Approval Number:    Date Approved/Denied:   PASRR Number: [id number] Discharge Plan: SNF    Current Diagnoses: Patient Active Problem List   Diagnosis Date Noted   Gastroesophageal reflux disease without esophagitis 12/20/2023   Depressive disorder 12/20/2023   Osteoporosis 12/20/2023   Stage 3 chronic kidney disease (HCC) 12/20/2023   Closed left hip fracture (HCC) 12/20/2023   Closed right hip fracture, initial encounter (HCC) 12/20/2023   Type II diabetes mellitus (HCC) 12/20/2023   Type 2 diabetes mellitus with hyperglycemia (HCC) 12/20/2023   Late onset Alzheimer's dementia without behavioral disturbance (HCC) 10/27/2020   Essential hypertension 03/02/2016   Type 2 diabetes mellitus with circulatory disorder (HCC) 03/02/2016   Coronary artery disease 03/01/2016   Hypokalemia 02/29/2016   Mild intermittent asthma without complication 08/17/2015   Hyperlipidemia 08/03/2015   Hydronephrosis, right 06/05/2015   Iron deficiency anemia 06/05/2015   Obesity 06/05/2015   Obstructive sleep apnea syndrome 06/05/2015   Peripheral neuropathy 06/05/2015   Vitamin D deficiency 06/05/2015   Fall 03/30/2013   Pelvic contusion 03/30/2013    Orientation RESPIRATION BLADDER Height & Weight     Self  Normal Incontinent Weight: 80.1 kg Height:  5' 7 (170.2 cm)  BEHAVIORAL SYMPTOMS/MOOD NEUROLOGICAL BOWEL NUTRITION STATUS       Incontinent Diet (CHO MOD)  AMBULATORY STATUS COMMUNICATION OF NEEDS Skin   Extensive Assist Verbally Normal                       Personal Care Assistance Level of Assistance  Bathing, Feeding, Dressing Bathing Assistance: Maximum assistance Feeding assistance: Limited assistance Dressing Assistance: Maximum assistance     Functional Limitations Info  Sight, Hearing, Speech Sight Info: Impaired (eyeglasses) Hearing Info: Adequate Speech Info: Adequate    SPECIAL CARE FACTORS FREQUENCY  PT (By licensed PT), OT (By licensed OT)     PT Frequency: 5x week OT Frequency: 5x week            Contractures Contractures Info: Not present    Additional Factors Info  Code Status, Allergies, Psychotropic Code Status Info: DNR Allergies Info: Xylocaine (Lidocaine Hcl), Roxicodone (Oxycodone), Zestril (Lisinopril), Zofran (Ondansetron Hcl), Wellbutrin (Bupropion) Psychotropic Info: Zyprexa          Current Medications (12/26/2023):  This is the current hospital active medication list Current Facility-Administered Medications  Medication Dose Route Frequency Provider Last Rate Last Admin   acetaminophen  (TYLENOL ) tablet 650 mg  650 mg Oral Q6H PRN Swinteck, Redell, MD       Or   acetaminophen  (TYLENOL ) suppository 650 mg  650 mg Rectal Q6H PRN Swinteck, Redell, MD       albuterol  (PROVENTIL ) (2.5 MG/3ML) 0.083% nebulizer solution 2.5 mg  2.5 mg Inhalation Q6H PRN Fidel Redell, MD       aspirin  EC tablet 325 mg  325 mg Oral Q breakfast Swinteck, Brian, MD   325 mg at 12/25/23 [id number]  atorvastatin  (LIPITOR) tablet 10 mg  10 mg Oral Daily Swinteck, Redell, MD   10 mg at 12/25/23 1104   cyanocobalamin  (VITAMIN B12) tablet 1,000 mcg  1,000 mcg Oral Daily Swinteck, Redell, MD   1,000 mcg at 12/25/23 1105   docusate sodium  (COLACE) capsule 100 mg  100 mg Oral BID Fidel Redell, MD   100 mg at 12/25/23 2243   DULoxetine  (CYMBALTA ) DR capsule 30 mg  30 mg Oral Daily Swinteck, Redell, MD    30 mg at 12/25/23 1105   feeding supplement (ENSURE PLUS HIGH PROTEIN) liquid 237 mL  237 mL Oral BID BM Swinteck, Redell, MD   237 mL at 12/25/23 1355   hydrALAZINE  (APRESOLINE ) injection 10 mg  10 mg Intravenous Q6H PRN Swinteck, Redell, MD       HYDROcodone -acetaminophen  (NORCO/VICODIN) 5-325 MG per tablet 0.5 tablet  0.5 tablet Oral Q4H PRN Fidel Redell, MD   0.5 tablet at 12/24/23 1515   HYDROmorphone  (DILAUDID ) injection 0.5 mg  0.5 mg Intravenous Q2H PRN Fidel Redell, MD   0.5 mg at 12/26/23 0340   hydrOXYzine  (ATARAX ) tablet 12.5 mg  12.5 mg Oral Q8H PRN Swinteck, Redell, MD       insulin  aspart (novoLOG ) injection 0-5 Units  0-5 Units Subcutaneous QHS Dorinda Drue DASEN, MD   3 Units at 12/25/23 2243   insulin  aspart (novoLOG ) injection 0-9 Units  0-9 Units Subcutaneous TID WC Dorinda Drue DASEN, MD   2 Units at 12/26/23 9163   lactulose  (CHRONULAC ) 10 GM/15ML solution 20 g  20 g Oral Daily PRN Fidel Redell, MD       menthol -cetylpyridinium (CEPACOL) lozenge 3 mg  1 lozenge Oral PRN Swinteck, Redell, MD       Or   phenol (CHLORASEPTIC) mouth spray 1 spray  1 spray Mouth/Throat PRN Swinteck, Redell, MD       methocarbamol  (ROBAXIN ) tablet 500 mg  500 mg Oral Q6H PRN Swinteck, Redell, MD       Or   methocarbamol  (ROBAXIN ) injection 500 mg  500 mg Intravenous Q6H PRN Swinteck, Redell, MD       metoCLOPramide  (REGLAN ) tablet 5-10 mg  5-10 mg Oral Q8H PRN Swinteck, Redell, MD       Or   metoCLOPramide  (REGLAN ) injection 5-10 mg  5-10 mg Intravenous Q8H PRN Swinteck, Redell, MD       montelukast  (SINGULAIR ) tablet 10 mg  10 mg Oral QHS Swinteck, Redell, MD   10 mg at 12/25/23 2243   naloxone  (NARCAN ) injection 0.4 mg  0.4 mg Intravenous PRN Swinteck, Redell, MD       OLANZapine  (ZYPREXA ) tablet 5 mg  5 mg Oral BID Fidel Redell, MD   5 mg at 12/25/23 2243   pantoprazole  (PROTONIX ) EC tablet 40 mg  40 mg Oral Daily Swinteck, Redell, MD   40 mg at 12/25/23 1105   polyethylene glycol (MIRALAX  /  GLYCOLAX ) packet 17 g  17 g Oral BID Djan, Prince T, MD   17 g at 12/25/23 2244   prochlorperazine  (COMPAZINE ) injection 10 mg  10 mg Intravenous Q6H PRN Swinteck, Redell, MD       rivastigmine  (EXELON ) 9.5 mg/24hr 9.5 mg  9.5 mg Transdermal q AM Swinteck, Brian, MD   9.5 mg at 12/26/23 0631   senna (SENOKOT) tablet 8.6 mg  1 tablet Oral BID Fidel Redell, MD   8.6 mg at 12/25/23 2243     Discharge Medications: Please see discharge summary for a list of discharge medications.  Relevant Imaging Results:  Relevant Lab Results:   Additional Information SS#[id number] [phone], [place], [place]

## 2023-12-26 NOTE — Progress Notes (Signed)
 Occupational Therapy Treatment Patient Details Name: Benjamin Jackson MRN: [id number] DOB: 10-21-1933 Today's Date: 12/26/2023   History of present illness Benjamin Jackson is a 89 yr old male who had a fall and sustained a displaced R femoral neck fracture. He is s/p Right hip hemiarthroplasty, anterior approach on 12-21-23. PMH: CKD III, DM II, HTN, HLD, OSA, Alzheimer's dementia, obesity, vitamin D deficiency, osteoporosis, GERD, depression   OT comments  Pt with slower progress towards OT goals due to cognitive barriers and pain (premedicated prior to session). Overall, pt continues to require +2 assist for bed mobility and standing attempts with RW d/t significant posterior bias. NT present and reports pt with difficulty self feeding as well. Plan to further address self feeding in next sessions as able. Patient will benefit from continued inpatient follow up therapy, <3 hours/day at DC.      If plan is discharge home, recommend the following:  Two people to help with walking and/or transfers;A lot of help with bathing/dressing/bathroom;Supervision due to cognitive status;Direct supervision/assist for medications management;Assistance with feeding   Equipment Recommendations  Other (comment);Wheelchair (measurements OT);Wheelchair cushion (measurements OT) (TBD pending progress)    Recommendations for Other Services      Precautions / Restrictions Precautions Precautions: Fall Restrictions Weight Bearing Restrictions Per Provider Order: Yes RLE Weight Bearing Per Provider Order: Weight bearing as tolerated       Mobility Bed Mobility Overal bed mobility: Needs Assistance Bed Mobility: Supine to Sit, Sit to Supine     Supine to sit: Total assist, +2 for physical assistance Sit to supine: Total assist, +2 for physical assistance   General bed mobility comments: reliant on total +2 assist, moans in pain    Transfers Overall transfer level: Needs assistance Equipment used: Rolling  walker (2 wheels) Transfers: Sit to/from Stand Sit to Stand: Max assist, +2 physical assistance, Total assist           General transfer comment: Max-Total of 2 to stand at bedside while NT assisting with linen change. Second stand with some improvements in posture but overall posterior bias noted. unable to stand fully upright or take steps     Balance Overall balance assessment: Needs assistance Sitting-balance support: Feet supported, No upper extremity supported Sitting balance-Leahy Scale: Fair     Standing balance support: Bilateral upper extremity supported, Reliant on assistive device for balance Standing balance-Leahy Scale: Zero                             ADL either performed or assessed with clinical judgement   ADL Overall ADL's : Needs assistance/impaired                                            Extremity/Trunk Assessment Upper Extremity Assessment Upper Extremity Assessment: Overall WFL for tasks assessed;Right hand dominant   Lower Extremity Assessment Lower Extremity Assessment: Defer to PT evaluation        Vision   Vision Assessment?: No apparent visual deficits   Perception     Praxis     Communication Communication Communication: Impaired Factors Affecting Communication: Hearing impaired   Cognition Arousal: Alert Behavior During Therapy: Anxious, Restless Cognition: History of cognitive impairments, Cognition impaired   Orientation impairments: Place, Time, Situation Awareness: Intellectual awareness impaired, Online awareness impaired Memory impairment (select all impairments): Short-term memory, Declarative  long-term memory, Non-declarative long-term memory Attention impairment (select first level of impairment): Sustained attention Executive functioning impairment (select all impairments): Initiation, Organization, Sequencing, Reasoning OT - Cognition Comments: A&Ox1, becomes anxious with mobility,  perseverating on maintaining grasp on staff's hands/arms. able to answer some questions appropriately but at times nonsensical statements made.                 Following commands: Impaired Following commands impaired: Follows one step commands inconsistently, Follows one step commands with increased time      Cueing   Cueing Techniques: Verbal cues, Gestural cues, Tactile cues  Exercises      Shoulder Instructions       General Comments      Pertinent Vitals/ Pain       Pain Assessment Pain Assessment: Faces Faces Pain Scale: Hurts even more Pain Location: RLE with movement Pain Descriptors / Indicators: Grimacing, Guarding, Moaning Pain Intervention(s): Monitored during session, Premedicated before session, Limited activity within patient's tolerance  Home Living                                          Prior Functioning/Environment              Frequency  Min 1X/week        Progress Toward Goals  OT Goals(current goals can now be found in the care plan section)  Progress towards OT goals: OT to reassess next treatment  Acute Rehab OT Goals Patient Stated Goal: unable to state OT Goal Formulation: Patient unable to participate in goal setting Time For Goal Achievement: 01/05/24 Potential to Achieve Goals: Fair ADL Goals Pt Will Perform Eating: with min assist;sitting Pt Will Perform Grooming: with min assist;sitting Pt Will Perform Upper Body Dressing: with mod assist;sitting Additional ADL Goal #1: The pt will perform supine to sit with mod assist, in prep for progressive ADL participation.  Plan      Co-evaluation                 AM-PAC OT 6 Clicks Daily Activity     Outcome Measure   Help from another person eating meals?: A Lot Help from another person taking care of personal grooming?: A Lot Help from another person toileting, which includes using toliet, bedpan, or urinal?: Total Help from another person  bathing (including washing, rinsing, drying)?: Total Help from another person to put on and taking off regular upper body clothing?: Total Help from another person to put on and taking off regular lower body clothing?: Total 6 Click Score: 8    End of Session Equipment Utilized During Treatment: Rolling walker (2 wheels)  OT Visit Diagnosis: History of falling (Z91.81);Other symptoms and signs involving cognitive function;Pain;Muscle weakness (generalized) (M62.81);Other abnormalities of gait and mobility (R26.89);Cognitive communication deficit (R41.841) Pain - Right/Left: Right Pain - part of body: Hip   Activity Tolerance Patient limited by pain;Other (comment) (limited by cognition)   Patient Left in bed;with call bell/phone within reach;with bed alarm set   Nurse Communication Mobility status        Time: 8886-8871 OT Time Calculation (min): 15 min  Charges: OT General Charges $OT Visit: 1 Visit OT Treatments $Therapeutic Activity: 8-22 mins  Mliss NOVAK, OTR/L Acute Rehab Services Office: [phone]   Mliss Fish 12/26/2023, 12:37 PM

## 2023-12-26 NOTE — Progress Notes (Signed)
 Progress Note   Patient: Benjamin Jackson FMW:[id number] DOB: [birth year] DOA: 12/20/2023     6 DOS: the patient was seen and examined on 12/26/2023      Brief Narrative: Benjamin Jackson is an 89 y.o. male with a history of T2DM, HTN, HLD, stage IIIa CKD, OSA, Alzheimer's dementia, obesity, vitamin D deficiency, osteoporosis, GERD, depression who presented to the ED on 12/20/2023 after a fall at his facility with right hip pain confirmed to have a right femoral neck fracture with valgus angulation. He was admitted, ultimately underwent anterior-approach right hemiarthroplasty 7/24.  PT OT working with patient and has recommended SNF   Assessment & Plan: Closed displaced right hip fracture: s/p right THA, anterior approach by Dr. Fidel 7/24.  - Continue routine postoperative care, orthopedics following.  - PT/OT on board and have recommended skilled nursing facility [facility] on board and working on this Continue analgesia (with bowel regimen) and VTE ppx per orthopedics.     Dysphagia:  There was some complaint of patient pocketing his pills Patient was seen by speech therapist with recommendation of regular diet with thin liquids   Alzheimer's dementia: Resides at [facility].  - Delirium precautions. Ideally would return to familiar environment at discharge.  - Continue olanzapine  BID - Continue rivastigmine  patch   CAD, HLD: No chest pain reported.  - Continue statin. Full dose daily aspirin  for VTE ppx per orthopedics, after this duration, plan to return to baseline 81mg  dosing.    T2DM:  - Continue SSI, hold home metformin for now.    HTN:  - prn hydralazine    GERD:  Continue PPI therapy   Postoperative blood loss anemia on chronic iron deficiency anemia:  - Monitor H&H, continue po iron   OSA:  - CPAP qHS       Family Communication: None at bedside   Disposition: Pending SNF, TOC working on this as well as insurance prior authorization   Status is: Inpatient    Time spent: 38 minutes   Subjective:  Patient still confused however able to communicate  Nurses denies any acute overnight event Patient denies nausea vomiting chest pain cough  Physical Exam: Gen: Elderly male laying in bed confused Pulm: Clear, nonlabored, diminished  CV: RRR, no MRG GI: Soft, NT, ND, +BS  Neuro: Alert and disoriented, not cooperative with most commands. Does resist movement in R foot with good strength, withdraws to nailbed pressure as well. Ext: Warm, dry Skin: R anterolateral hip incision dressing clean and dry   Data Reviewed:    Latest Ref Rng & Units 12/26/2023    4:35 AM 12/25/2023    4:49 AM 12/24/2023    4:46 AM  CBC  WBC 4.0 - 10.5 K/uL 7.4  8.3  8.5   Hemoglobin 13.0 - 17.0 g/dL 89.3  9.5  89.9   Hematocrit 39.0 - 52.0 % 33.3  30.2  31.7   Platelets 150 - 400 K/uL 156  165  157        Latest Ref Rng & Units 12/26/2023    4:35 AM 12/25/2023    4:49 AM 12/24/2023    4:46 AM  BMP  Glucose 70 - 99 mg/dL 812  829  807   BUN 8 - 23 mg/dL 28  28  27    Creatinine 0.61 - 1.24 mg/dL 9.08  8.98  9.19   Sodium 135 - 145 mmol/L 133  137  139   Potassium 3.5 - 5.1 mmol/L 4.6  4.2  4.2   Chloride 98 - 111 mmol/L 102  106  111   CO2 22 - 32 mmol/L 22  24  21    Calcium  8.9 - 10.3 mg/dL 8.3  8.3  8.1       Vitals:   12/25/23 1303 12/25/23 2016 12/26/23 0558 12/26/23 1329  BP: 133/68 112/64 (!) 148/64 129/62  Pulse: 80 71 65 77  Resp: 18 18 16 16   Temp: 99 F (37.2 C) 98.9 F (37.2 C) 97.7 F (36.5 C) 98 F (36.7 C)  TempSrc: Oral Oral Oral Oral  SpO2: 95% 98% 98% 100%  Weight:   80.1 kg   Height:        Author: Drue ONEIDA Potter, MD 12/26/2023 2:49 PM  For on call review [email or web address].

## 2023-12-27 DIAGNOSIS — D539 Nutritional anemia, unspecified: Secondary | ICD-10-CM | POA: Diagnosis not present

## 2023-12-27 DIAGNOSIS — E871 Hypo-osmolality and hyponatremia: Secondary | ICD-10-CM | POA: Diagnosis not present

## 2023-12-27 DIAGNOSIS — G301 Alzheimer's disease with late onset: Secondary | ICD-10-CM

## 2023-12-27 DIAGNOSIS — S72001A Fracture of unspecified part of neck of right femur, initial encounter for closed fracture: Secondary | ICD-10-CM | POA: Diagnosis not present

## 2023-12-27 DIAGNOSIS — I10 Essential (primary) hypertension: Secondary | ICD-10-CM

## 2023-12-27 DIAGNOSIS — F0280 Dementia in other diseases classified elsewhere without behavioral disturbance: Secondary | ICD-10-CM

## 2023-12-27 DIAGNOSIS — I2510 Atherosclerotic heart disease of native coronary artery without angina pectoris: Secondary | ICD-10-CM

## 2023-12-27 DIAGNOSIS — Z66 Do not resuscitate: Secondary | ICD-10-CM | POA: Insufficient documentation

## 2023-12-27 LAB — CBC WITH DIFFERENTIAL/PLATELET
Abs Immature Granulocytes: 0.1 K/uL — ABNORMAL HIGH (ref 0.00–0.07)
Basophils Absolute: 0 K/uL (ref 0.0–0.1)
Basophils Relative: 0 %
Eosinophils Absolute: 0.2 K/uL (ref 0.0–0.5)
Eosinophils Relative: 2 %
HCT: 30.8 % — ABNORMAL LOW (ref 39.0–52.0)
Hemoglobin: 9.8 g/dL — ABNORMAL LOW (ref 13.0–17.0)
Immature Granulocytes: 1 %
Lymphocytes Relative: 9 %
Lymphs Abs: 0.9 K/uL (ref 0.7–4.0)
MCH: 34.8 pg — ABNORMAL HIGH (ref 26.0–34.0)
MCHC: 31.8 g/dL (ref 30.0–36.0)
MCV: 109.2 fL — ABNORMAL HIGH (ref 80.0–100.0)
Monocytes Absolute: 0.9 K/uL (ref 0.1–1.0)
Monocytes Relative: 9 %
Neutro Abs: 7.8 K/uL — ABNORMAL HIGH (ref 1.7–7.7)
Neutrophils Relative %: 79 %
Platelets: 215 K/uL (ref 150–400)
RBC: 2.82 MIL/uL — ABNORMAL LOW (ref 4.22–5.81)
RDW: 12.8 % (ref 11.5–15.5)
WBC: 10 K/uL (ref 4.0–10.5)
nRBC: 0 % (ref 0.0–0.2)

## 2023-12-27 LAB — GLUCOSE, CAPILLARY
Glucose-Capillary: 161 mg/dL — ABNORMAL HIGH (ref 70–99)
Glucose-Capillary: 294 mg/dL — ABNORMAL HIGH (ref 70–99)

## 2023-12-27 LAB — BASIC METABOLIC PANEL WITH GFR
Anion gap: 12 (ref 5–15)
BUN: 23 mg/dL (ref 8–23)
CO2: 20 mmol/L — ABNORMAL LOW (ref 22–32)
Calcium: 8.6 mg/dL — ABNORMAL LOW (ref 8.9–10.3)
Chloride: 97 mmol/L — ABNORMAL LOW (ref 98–111)
Creatinine, Ser: 0.92 mg/dL (ref 0.61–1.24)
GFR, Estimated: >60 mL/min (ref >60)
Glucose, Bld: 163 mg/dL — ABNORMAL HIGH (ref 70–99)
Potassium: 4.5 mmol/L (ref 3.5–5.1)
Sodium: 129 mmol/L — ABNORMAL LOW (ref 135–145)

## 2023-12-27 LAB — VITAMIN B12: Vitamin B-12: 921 pg/mL — ABNORMAL HIGH (ref 180–914)

## 2023-12-27 MED ORDER — METHOCARBAMOL 500 MG PO TABS: 500.0000 mg | ORAL_TABLET | Freq: Four times a day (QID) | ORAL | Status: AC | PRN

## 2023-12-27 MED ORDER — PANTOPRAZOLE SODIUM 40 MG PO TBEC: 40.0000 mg | DELAYED_RELEASE_TABLET | Freq: Every day | ORAL | Status: AC

## 2023-12-27 NOTE — Assessment & Plan Note (Addendum)
 Prior to 12-27-2023 on prn IV hydralazine   12-27-2023 stable without HTN meds

## 2023-12-27 NOTE — Care Management Important Message (Signed)
 Important Message  Patient Details IM Letter mailed to Benjamin Jackson. Name: [name] MRN: [id number] Date of Birth: December 23, 1933   Important Message Given:  Yes - Medicare IM     Valissa Lyvers 12/27/2023, 12:45 PM

## 2023-12-27 NOTE — Progress Notes (Signed)
 SLP Cancellation Note  Patient Details Name: Benjamin Jackson MRN: [id number] DOB: [birth year]   Cancelled treatment:       Reason Eval/Treat Not Completed: Other (comment);Fatigue/lethargy limiting ability to participate (pt did not awaken adequately for po; RN and NT reports he did well with his breakfast, benefits from full supervision to be informed to swallow if prolonged holding noted) Benjamin POUR, MS [facility] SLP Acute Rehab Services Office [phone]   Benjamin Jackson 12/27/2023, 10:13 AM

## 2023-12-27 NOTE — Assessment & Plan Note (Addendum)
 Prior to 12-27-2023 chronic  12-27-2023 chronic. Stable. Continue Exelon  patch.

## 2023-12-27 NOTE — Progress Notes (Signed)
 Report called to Leretha, Charity fundraiser at [employer]. All questions answered. AVS reviewed. Ivs removed. Pt placed in paper gown. Awaiting PTAR that is scheduled at 1pm. Wife updated via phone.

## 2023-12-27 NOTE — TOC Transition Note (Addendum)
 Transition of Care [facility]) - Discharge Note   Patient Details  Name: Benjamin Jackson MRN: [id number] Date of Birth: [birth year]  [facility] CM/SW Contact:  Bascom Service, RN Phone Number: 12/27/2023, 9:49 AM   Clinical Narrative:  d/c to [facility] rep [facility] accepted.d/c summary.sent-awaiting rm#,report# prior PTAR. -10:55a going to [facility] [employer] rm#[id number], report #[phone].Wife to sign patient in facility @ 1p-she will bring CPAP;PTAR scheduled for 1p pick up. No further CM needs.     Final next level of care: Skilled Nursing Facility Barriers to Discharge: No Barriers Identified   Patient Goals and CMS Choice Patient states their goals for this hospitalization and ongoing recovery are:: Rehab CMS Medicare.gov Compare Post Acute Care list provided to:: Patient Represenative (must comment) (Patricia(spouse)) Choice offered to / list presented to : Spouse [place] ownership interest in [facility].provided to:: Spouse    Discharge Placement   Existing PASRR number confirmed : 12/25/23          Patient chooses bed at: [facility] Patient to be transferred to facility by: PTAR Name of family member notified: Patricia(spouse) Patient and family notified of of transfer: 12/27/23  Discharge Plan and Services Additional resources added to the After Visit Summary for     Discharge Planning Services: CM Consult Post Acute Care Choice: Skilled Nursing Facility                               Social Drivers of Health (SDOH) Interventions SDOH Screenings   Food Insecurity: No Food Insecurity (12/20/2023)  Housing: Low Risk  (12/20/2023)  Transportation Needs: No Transportation Needs (12/20/2023)  Utilities: Not At Risk (12/20/2023)  Social Connections: Patient Unable To Answer (12/20/2023)  Tobacco Use: Unknown (12/20/2023)     Readmission Risk Interventions    12/21/2023    2:10 PM  Readmission Risk Prevention Plan  Transportation  Screening Complete  PCP or Specialist Appt within 3-5 Days Complete  HRI or Home Care Consult Complete  Palliative Care Screening Not Applicable

## 2023-12-27 NOTE — Assessment & Plan Note (Addendum)
 Prior to 12-27-2023 admission HgB of 11.9. last iron panel done in 07-2023 negative for iron deficiency. Pt did not require PRBC transfusion.  12-27-2023 Vitamin B12 and RBC folate sent. Results pending.

## 2023-12-27 NOTE — Progress Notes (Signed)
 Report attempted to [facility], no answer. Will attempt at later time.

## 2023-12-27 NOTE — Assessment & Plan Note (Addendum)
 Prior to 12-27-2023 On Zyprexa  and Cymbalta /Atarax  at home  12-27-2023 continue zyprexa , cymbalta  and hydroxyzine  at [facility]

## 2023-12-27 NOTE — Assessment & Plan Note (Addendum)
 Prior to 12-27-2023 on lipitor 10 mg daily  12-27-2023 continue lipitor

## 2023-12-27 NOTE — Discharge Summary (Addendum)
 [facility] Phys[facility] Discharge Summary   Patient name: Benjamin Jackson  Admit date:     12/20/2023  Discharge date: 12/27/2023  Attending Physician: CELINDA ALM LOT [[id number]]  Discharge Physician: Camellia Door   PCP: Delilah Murray HERO., MD  Admitted From: SN[facility]  Disposition:  [facility]  Recommendations for Outpatient Follow-up:  Follow up with PCP in 1-2 weeks F/U with Dr. Cherylene with orthopedics in 1 weeks. Call for appointment Please follow up on the following pending results: Vitamin B12 and RBC folate  Home Health:No Equipment/Devices: None  Discharge Condition:Stable CODE STATUS:DNR/DNI Diet recommendation: Dysphagia. Mechanical soft diet Fluid Restriction: None  Hospital Summary: HPI: Benjamin Jackson is a 89 y.o. male with medical history significant of type II DM, hypertension, depression, hyperlipidemia, right hydronephrosis, GERD, late onset Alzheimer's, mild asthma, class I obesity, obstructive sleep apnea, osteoporosis, peripheral neuropathy, stage III CKD, type 2 diabetes, vitamin D deficiency who had a mechanical fall at his facility injuring his right pelvic area and sustaining a closed right hip fracture.  He is currently sedated and unable to provide further information.   Significant Events: Admitted 12/20/2023 for right hip fracture 12-21-2023 right hip hemiarthroplasty  Admission Labs: white count 7.6, hemoglobin 11.9 g/dL with an MCV of 887.8 fL and platelet 150.6. BMP showed a glucose of 217 and BUN of 36 mg/dL, creatinine and electrolytes were normal   Admission Imaging Studies: Portable 1 view chest radiograph no evidence of acute cardiopulmonary process stable cardiomegaly.  Right hip x-ray showing acute right femoral neck fracture with valgus angulation. Lower lumbar spondylosis and DDD.   Significant Labs: A1C 7.0%  Significant Imaging Studies:   Antibiotic Therapy: Anti-infectives (From admission, onward)    Start      Dose/Rate Route Frequency Ordered Stop   12/21/23 2200  ceFAZolin  (ANCEF ) IVPB 2g/100 mL premix        2 g 200 mL/hr over 30 Minutes Intravenous Every 6 hours 12/21/23 2012 12/22/23 0429   12/21/23 1400  ceFAZolin  (ANCEF ) IVPB 2g/100 mL premix        2 g 200 mL/hr over 30 Minutes Intravenous On call to O.R. 12/21/23 1009 12/21/23 1558       Procedures: 12-21-2023 right hip hemiarthroplasty  Consultants: [facility] Course by Problem: * Closed right hip fracture, initial encounter [facility]) Prior to 12-27-2023 pt taken to OR on 12-21-2023 for right hemiarthroplasty. On ASA for DVT prophylaxis. PT/OT recommending SNF.  Insurance auth obtained and SNF bed available.  12-27-2023 stable for DC to SNF. WBAT. On ASA for dvt prophylaxis. F/U in ortho office([facility]) for dressing removal.  Macrocytic anemia Prior to 12-27-2023 admission HgB of 11.9. last iron panel done in 07-2023 negative for iron deficiency. Pt did not require PRBC transfusion.  12-27-2023 Vitamin B12 and RBC folate sent. Results pending.  Chronic hyponatremia Prior to 12-27-2023 baseline Na of 131-136  12-27-2023 discharge Na of 129. Stable.  Type 2 diabetes mellitus with hyperglycemia (HCC) Prior to 12-27-2023 On metformin at home. Placed on SSI  12-27-2023 A1c of 7.0 %, continue metformin at home.  Obstructive sleep apnea syndrome Prior to 12-27-2023 CPAP at bedtime.   12-27-2023 continue CPAP at bedtime.  Depressive disorder Prior to 12-27-2023 On Zyprexa  and Cymbalta /Atarax  at home  12-27-2023 continue zyprexa , cymbalta  and hydroxyzine  at SNF  Late onset Alzheimer's dementia without behavioral disturbance (HCC) Prior to 12-27-2023 chronic  12-27-2023 chronic. Stable. Continue Exelon  patch.  Hyperlipidemia Prior to 12-27-2023 on lipitor  10 mg daily  12-27-2023 continue lipitor  Gastroesophageal reflux disease without esophagitis Prior to 12-27-2023 on po protonix   12-27-2023  continue protonix   at SNF.  Essential hypertension Prior to 12-27-2023 on prn IV hydralazine   12-27-2023 stable without HTN meds  Coronary artery disease Prior to 12-27-2023 On aspirin  and atorvastatin  at home.  Will resume Lipitor for now.   12-27-2023 continue lipitor. 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 behavioral disturbance (HCC)   Depressive disorder   Obstructive sleep apnea syndrome   Type 2 diabetes mellitus with hyperglycemia (HCC)   Chronic hyponatremia   DNR (do not resuscitate)/DNI(Do Not Intubate)   Discharge Instructions  Discharge Instructions     Call MD for:  difficulty breathing, headache or visual disturbances   Complete by: As directed    Call MD for:  extreme fatigue   Complete by: As directed    Call MD for:  hives   Complete by: As directed    Call MD for:  persistant dizziness or light-headedness   Complete by: As directed    Call MD for:  persistant nausea and vomiting   Complete by: As directed    Call MD for:  redness, tenderness, or signs of infection (pain, swelling, redness, odor or green/yellow discharge around incision site)   Complete by: As directed    Call MD for:  severe uncontrolled pain   Complete by: As directed    Call MD for:  temperature >100.4   Complete by: As directed    DIET DYS 3   Complete by: As directed    Mechanical soft diet with thin liquids   Fluid consistency: Thin   Diet - low sodium heart healthy   Complete by: As directed    Discharge instructions   Complete by: As directed    1. Follow up with your primary care provider in 1-2 weeks following discharge from hospital. 2.  Follow up with orthopedics in 1 week. Call for appointment. Dr. Cherylene   Increase activity slowly   Complete by: As directed    Leave dressing on - Keep it clean, dry, and intact until clinic visit   Complete by: As directed    Weight bearing as tolerated   Complete by: As directed    Laterality: right   Extremity: Lower      Allergies as of 12/27/2023       Reactions   Xylocaine [lidocaine Hcl] Anaphylaxis   Per wife, patient had four xylocaine injections in the back when he was in the [employer], and had what sounded like overdose/local anesthesia toxicity. Has since received lidocaine without issue.   Roxicodone [oxycodone] Other (See Comments)   Dizziness  Confusion Hallucinations Able to tolerate Norco Allergic, per [facility]   Zestril [lisinopril] Other (See Comments)   Allergic, per [facility]   Zofran [ondansetron Hcl] Dermatitis   Wellbutrin [bupropion] Other (See Comments)   Allergic, per [facility]        Medication List     STOP taking these medications    aspirin  EC 81 MG tablet Replaced by: aspirin  81 MG chewable tablet       TAKE these medications  acetaminophen  500 MG tablet Commonly known as: TYLENOL  Take 1,000 mg by mouth every 6 (six) hours as needed (pain).   albuterol  108 (90 Base) MCG/ACT inhaler Commonly known as: VENTOLIN  HFA Inhale 2 puffs into the lungs every 6 (six) hours as needed for wheezing.   aspirin  81 MG chewable tablet Commonly known as: Aspirin  Childrens Chew 1 tablet (81 mg total) by mouth 2 (two) times daily with a meal. Replaces: aspirin  EC 81 MG tablet   atorvastatin  10 MG tablet Commonly known as: LIPITOR Take 10 mg by mouth daily.   DULoxetine  30 MG capsule Commonly known as: CYMBALTA  Take 30 mg by mouth daily.   HYDROcodone -acetaminophen  5-325 MG tablet Commonly known as: NORCO/VICODIN Take 1 tablet by mouth every 4 (four) hours as needed for up to 7 days for moderate pain (pain score 4-6) or severe pain (pain score  7-10).   hydrOXYzine  25 MG tablet Commonly known as: ATARAX  Take 12.5 mg by mouth every 8 (eight) hours as needed (agitation).   lactulose  10 GM/15ML solution Commonly known as: CHRONULAC  Take 20 g by mouth daily as needed (constipation).   metFORMIN 500 MG tablet Commonly known as: GLUCOPHAGE Take 1,000 mg by mouth daily with breakfast.   methocarbamol  500 MG tablet Commonly known as: ROBAXIN  Take 1 tablet (500 mg total) by mouth every 6 (six) hours as needed for up to 14 days for muscle spasms.   montelukast  10 MG tablet Commonly known as: SINGULAIR  Take 10 mg by mouth at bedtime.   OLANZapine  5 MG tablet Commonly known as: ZYPREXA  Take 5 mg by mouth in the morning and at bedtime.   pantoprazole  40 MG tablet Commonly known as: PROTONIX  Take 1 tablet (40 mg total) by mouth daily. Start taking on: December 28, 2023   polyethylene glycol powder 17 GM/SCOOP powder Commonly known as: GLYCOLAX /MIRALAX  Take 17 g by mouth 2 (two) times daily.   rivastigmine  9.5 mg/24hr Commonly known as: EXELON  Place 9.5 mg onto the skin in the morning.   SlowMag Mg Muscle/Heart 71.5-119 MG Tbec SR tablet Generic drug: magnesium chloride Take 1 tablet by mouth 2 (two) times daily.   Vitamin B-12 CR 1000 MCG Tbcr Take 1,000 mcg by mouth daily.               Discharge Care Instructions  (From admission, onward)           Start     Ordered   12/27/23 0000  Weight bearing as tolerated       Question Answer Comment  Laterality right   Extremity Lower      12/27/23 0914   12/27/23 0000  Leave dressing on - Keep it clean, dry, and intact until clinic visit        12/27/23 0914            Follow-up Information     Leigh Valery RAMAN, PA-C. Schedule an appointment as soon as possible for a visit in 2 week(s).   Specialty: Orthopedic Surgery Why: For suture removal, For wound re-check Contact information: [address] [place] [address] [phone]                 Allergies  Allergen Reactions   Xylocaine [Lidocaine Hcl] Anaphylaxis    Per wife, patient had four xylocaine injections in the back when he was in the [employer], and had what sounded like overdose/local anesthesia toxicity. Has since received lidocaine without issue.   Roxicodone [Oxycodone] Other (See Comments)  Dizziness  Confusion Hallucinations Able to tolerate Norco Allergic, per [facility]   Zestril [Lisinopril] Other (See Comments)    Allergic, per [facility]   Zofran [Ondansetron Hcl] Dermatitis   Wellbutrin [Bupropion] Other (See Comments)    Allergic, per [facility]    Discharge Exam: Vitals:   12/26/23 1948 12/27/23 0400  BP: 118/61 (!) 153/72  Pulse: 81 80  Resp: 16 17  Temp: 99.2 F (37.3 C) 98.8 F (37.1 C)  SpO2: 97% 100%    Physical Exam Vitals and nursing note reviewed.  Constitutional:      Comments: Awake but demented  HENT:     Head: Normocephalic and atraumatic.  Cardiovascular:     Rate and Rhythm: Normal rate and regular rhythm.  Pulmonary:     Effort: Pulmonary effort is normal. No respiratory distress.     Breath sounds: Normal breath sounds. No wheezing.  Abdominal:     General: Bowel sounds are normal. There is no distension.     Palpations: Abdomen is soft.  Skin:    General: Skin is warm and dry.     Capillary Refill: Capillary refill takes less than 2 seconds.     Comments: Small scab below right knee  Neurological:     General: No focal deficit present.     Mental Status: He is disoriented.     The results of significant diagnostics from this hospitalization (including imaging, microbiology, ancillary and laboratory) are listed below for reference.    Microbiology: Recent Results (from the past 240 hours)  Surgical PCR screen     Status: None   Collection Time: 12/20/23  6:07 PM   Specimen: Nasal Mucosa; Nasal Swab  Result Value Ref Range Status   MRSA, PCR NEGATIVE NEGATIVE Final   Staphylococcus aureus NEGATIVE NEGATIVE  Final    Comment: (NOTE) The Xpert SA Assay (FDA approved for NASAL specimens in patients 61 years of age and older), is one component of a comprehensive surveillance program. It is not intended to diagnose infection nor to guide or monitor treatment. Performed at [facility], [address] [place], [place] [address]      Labs: BNP (last 3 results) No results for input(s): BNP in the last 8760 hours. Basic Metabolic Panel: Recent Labs  Lab 12/23/23 0503 12/24/23 0446 12/25/23 0449 12/26/23 0435 12/27/23 0540  NA 141 139 137 133* 129*  K 4.4 4.2 4.2 4.6 4.5  CL 109 111 106 102 97*  CO2 24 21* 24 22 20*  GLUCOSE 186* 192* 170* 187* 163*  BUN 32* 27* 28* 28* 23  CREATININE 1.01 0.80 1.01 0.91 0.92  CALCIUM  8.4* 8.1* 8.3* 8.3* 8.6*   Liver Function Tests: Recent Labs  Lab 12/21/23 0516  AST 117*  ALT 109*  ALKPHOS 151*  BILITOT 1.0  PROT 6.5  ALBUMIN  2.6*   No results for input(s): LIPASE, AMYLASE in the last 168 hours. No results for input(s): AMMONIA in the last 168 hours. CBC: Recent Labs  Lab 12/20/23 1110 12/21/23 0516 12/23/23 0503 12/24/23 0446 12/25/23 0449 12/26/23 0435 12/27/23 0540  WBC 7.6   < > 9.5 8.5 8.3 7.4 10.0  NEUTROABS 5.4  --   --  6.6 6.1 5.8 7.8*  HGB 11.9*   < > 9.4* 10.0* 9.5* 10.6* 9.8*  HCT 38.0*   < > 28.8* 31.7* 30.2* 33.3* 30.8*  MCV 112.1*   < > 109.9* 112.8* 113.5* 109.9* 109.2*  PLT 158   < > 173 157 165  156 215   < > = values in this interval not displayed.   Cardiac Enzymes: No results for input(s): CKTOTAL, CKMB, CKMBINDEX, TROPONINI in the last 168 hours. BNP: No results for input(s): BNP in the last 168 hours. CBG: Recent Labs  Lab 12/25/23 2020 12/26/23 1140 12/26/23 1640 12/26/23 2145 12/27/23 0748  GLUCAP 273* 221* 166* 156* 161*   D-Dimer No results for input(s): DDIMER in the last 72 hours. Hgb A1c No results for input(s): HGBA1C in the last 72  hours. Lipid Profile No results for input(s): CHOL, HDL, LDLCALC, TRIG, CHOLHDL, LDLDIRECT in the last 72 hours. Thyroid function studies No results for input(s): TSH, T4TOTAL, FREET4, T3FREE, THYROIDAB in the last 72 hours.  Invalid input(s): FREET3 Anemia work up No results for input(s): VITAMINB12, FOLATE, FERRITIN, TIBC, IRON, RETICCTPCT in the last 72 hours. Urinalysis    Component Value Date/Time   COLORURINE YELLOW 12/16/2023 1958   APPEARANCEUR CLEAR 12/16/2023 1958   LABSPEC 1.015 12/16/2023 1958   PHURINE 7.0 12/16/2023 1958   GLUCOSEU NEGATIVE 12/16/2023 1958   HGBUR LARGE (A) 12/16/2023 1958   BILIRUBINUR NEGATIVE 12/16/2023 1958   KETONESUR NEGATIVE 12/16/2023 1958   PROTEINUR 100 (A) 12/16/2023 1958   UROBILINOGEN 0.2 03/30/2013 1702   NITRITE NEGATIVE 12/16/2023 1958   LEUKOCYTESUR NEGATIVE 12/16/2023 1958   Sepsis Labs Recent Labs  Lab 12/24/23 0446 12/25/23 0449 12/26/23 0435 12/27/23 0540  WBC 8.5 8.3 7.4 10.0    Procedures/Studies: DG Pelvis Portable Result Date: 12/21/2023 CLINICAL DATA:  Femoral neck fracture, postop. EXAM: PORTABLE PELVIS 1-2 VIEWS COMPARISON:  None Available. FINDINGS: Right hip hemiarthroplasty in expected alignment. No periprosthetic lucency or fracture. Recent postsurgical change includes air and edema in the soft tissues. Overlying skin staples in place. IMPRESSION: Right hip hemiarthroplasty without immediate postoperative complication. Electronically Signed   By: Andrea Gasman M.D.   On: 12/21/2023 19:17   DG HIP UNILAT WITH PELVIS 1V RIGHT Result Date: 12/21/2023 CLINICAL DATA:  Elective surgery. EXAM: DG HIP (WITH OR WITHOUT PELVIS) 1V RIGHT COMPARISON:  Preoperative imaging FINDINGS: Two fluoroscopic spot views of the pelvis and right hip obtained in the operating room. Images during hip arthroplasty. Fluoroscopy time 7 seconds. Dose 1.14 mGy. IMPRESSION: Intraoperative fluoroscopy during  right hip arthroplasty. Electronically Signed   By: Andrea Gasman M.D.   On: 12/21/2023 19:16   DG C-Arm 1-60 Min-No Report Result Date: 12/21/2023 Fluoroscopy was utilized by the requesting physician.  No radiographic interpretation.   DG C-Arm 1-60 Min-No Report Result Date: 12/21/2023 Fluoroscopy was utilized by the requesting physician.  No radiographic interpretation.   DG Knee Right Port Result Date: 12/20/2023 CLINICAL DATA:  Right femoral neck fracture. EXAM: PORTABLE RIGHT KNEE - 1-2 VIEW COMPARISON:  December 15, 2023. FINDINGS: No evidence of fracture, dislocation, or joint effusion. Moderate to severe narrowing of the patellofemoral, medial and lateral joint spaces is noted. Soft tissues are unremarkable. IMPRESSION: Moderate to severe tricompartmental degenerative joint disease. No acute abnormality seen. Electronically Signed   By: Lynwood Landy Raddle M.D.   On: 12/20/2023 14:39   DG Chest Port 1 View Result Date: 12/20/2023 CLINICAL DATA:  [id number] Pre-op cardiovascular exam [id number] Pre-op evaluation for known right hip fracture. EXAM: PORTABLE CHEST 1 VIEW COMPARISON:  Radiographs 06/16/2022 and 12/18/2018. FINDINGS: The heart size and mediastinal contours are stable with mild cardiac enlargement. The lungs appear clear. No evidence of pleural effusion or pneumothorax. Stable postsurgical changes from lower cervical fusion and left shoulder reverse arthroplasty. There  are old left-sided rib fractures without evidence of acute osseous abnormality. IMPRESSION: No evidence of acute cardiopulmonary process. Stable mild cardiac enlargement. Electronically Signed   By: Elsie Perone M.D.   On: 12/20/2023 11:26   DG Hip Unilat W or Wo Pelvis 2-3 Views Right Result Date: 12/20/2023 CLINICAL DATA:  Fall, right hip pain EXAM: DG HIP (WITH OR WITHOUT PELVIS) 2-3V RIGHT COMPARISON:  CT pelvis 09/11/2023 FINDINGS: Acute right femoral neck fracture with varus angulation. Lower lumbar spondylosis. Loss of  intervertebral disc height at L5-S1. Mild spurring of both acetabula. IMPRESSION: 1. Acute right femoral neck fracture with varus angulation. 2. Lower lumbar spondylosis and degenerative disc disease. Electronically Signed   By: Ryan Salvage M.D.   On: 12/20/2023 10:18   CT Head Wo Contrast Result Date: 12/16/2023 CLINICAL DATA:  89 year old male status post fall presenting on 12/15/2023 with possible small acute subarachnoid hemorrhage on motion degraded exam. Repeat Head CT 0020 hours today. Subsequent encounter. EXAM: CT HEAD WITHOUT CONTRAST TECHNIQUE: Contiguous axial images were obtained from the base of the skull through the vertex without intravenous contrast. RADIATION DOSE REDUCTION: This exam was performed according to the departmental dose-optimization program which includes automated exposure control, adjustment of the mA and/or kV according to patient size and/or use of iterative reconstruction technique. COMPARISON:  Head CT 0020 hours today, 1836 hours yesterday. FINDINGS: Brain: Stable cerebral volume. Anterior temporal lobe, mesial temporal lobe volume loss. No midline shift, ventriculomegaly, mass effect, evidence of mass lesion, intracranial hemorrhage or evidence of cortically based acute infarction. Confluent bilateral cerebral white matter hypodensity. Stable small dystrophic calcifications in the left periatrial white matter. Incidental dural calcifications also. Vascular: Calcified atherosclerosis at the skull base. No suspicious intracranial vascular hyperdensity. Skull: Previous left orbital fracture repair. No acute osseous abnormality identified. Sinuses/Orbits: Visualized paranasal sinuses and mastoids are stable and well aerated. Other: No acute orbit or scalp soft tissue finding. IMPRESSION: 1. No acute intracranial abnormality. No evidence of acute intracranial hemorrhage. 2. Temporal lobe atrophy. Advanced cerebral white matter disease. Cerebral Atrophy (ICD10-G31.9).  Electronically Signed   By: VEAR Hurst M.D.   On: 12/16/2023 11:57    Time coordinating discharge: 55 mins  SIGNED:  Camellia Door, DO [facility] 12/27/23, 9:37 AM

## 2023-12-27 NOTE — Assessment & Plan Note (Addendum)
 Prior to 12-27-2023 On aspirin  and atorvastatin  at home.  Will resume Lipitor for now.   12-27-2023 continue lipitor. On ASA for DVT prophylaxis and for CAD

## 2023-12-27 NOTE — Assessment & Plan Note (Addendum)
 Prior to 12-27-2023 pt taken to OR on 12-21-2023 for right hemiarthroplasty. On ASA for DVT prophylaxis. PT/OT recommending SNF.  Insurance auth obtained and SNF bed available.  12-27-2023 stable for DC to SNF. WBAT. On ASA for dvt prophylaxis. F/U in ortho office([facility]) for dressing removal.

## 2023-12-27 NOTE — Subjective & Objective (Signed)
 Pt seen and examined. Pt has SNF bed and ins authorization.

## 2023-12-27 NOTE — Assessment & Plan Note (Addendum)
 Prior to 12-27-2023 baseline Na of 131-136  12-27-2023 discharge Na of 129. Stable.

## 2023-12-27 NOTE — Assessment & Plan Note (Addendum)
 Prior to 12-27-2023 CPAP at bedtime.   12-27-2023 continue CPAP at bedtime.

## 2023-12-27 NOTE — Assessment & Plan Note (Addendum)
 Prior to 12-27-2023 on po protonix   12-27-2023  continue protonix  at SNF.

## 2023-12-27 NOTE — Assessment & Plan Note (Addendum)
 Prior to 12-27-2023 On metformin at home. Placed on SSI  12-27-2023 A1c of 7.0 %, continue metformin at home.

## 2023-12-27 NOTE — Progress Notes (Addendum)
 PROGRESS NOTE    Benjamin Jackson  FMW:[id number] DOB: 05-12-1934 DOA: 12/20/2023 PCP: Delilah Murray HERO., MD  Subjective: Pt seen and examined. Pt has SNF bed and ins authorization.   Hospital Course: HPI: Benjamin Jackson is a 89 y.o. male with medical history significant of type II DM, hypertension, depression, hyperlipidemia, right hydronephrosis, GERD, late onset Alzheimer's, mild asthma, class I obesity, obstructive sleep apnea, osteoporosis, peripheral neuropathy, stage III CKD, type 2 diabetes, vitamin D deficiency who had a mechanical fall at his facility injuring his right pelvic area and sustaining a closed right hip fracture.  He is currently sedated and unable to provide further information.   Significant Events: Admitted 12/20/2023 for right hip fracture 12-21-2023 right hip hemiarthroplasty  Admission Labs: white count 7.6, hemoglobin 11.9 g/dL with an MCV of 887.8 fL and platelet 150.6. BMP showed a glucose of 217 and BUN of 36 mg/dL, creatinine and electrolytes were normal   Admission Imaging Studies: Portable 1 view chest radiograph no evidence of acute cardiopulmonary process stable cardiomegaly.  Right hip x-ray showing acute right femoral neck fracture with valgus angulation. Lower lumbar spondylosis and DDD.   Significant Labs: A1C 7.0%  Significant Imaging Studies:   Antibiotic Therapy: Anti-infectives (From admission, onward)    Start     Dose/Rate Route Frequency Ordered Stop   12/21/23 2200  ceFAZolin  (ANCEF ) IVPB 2g/100 mL premix        2 g 200 mL/hr over 30 Minutes Intravenous Every 6 hours 12/21/23 2012 12/22/23 0429   12/21/23 1400  ceFAZolin  (ANCEF ) IVPB 2g/100 mL premix        2 g 200 mL/hr over 30 Minutes Intravenous On call to O.R. 12/21/23 1009 12/21/23 1558       Procedures: 12-21-2023 right hip hemiarthroplasty  Consultants: Orthopedics     Assessment and Plan: * Closed right hip fracture, initial encounter [facility]) Prior to 12-27-2023 pt taken  to OR on 12-21-2023 for right hemiarthroplasty. On ASA for DVT prophylaxis. PT/OT recommending SNF.  Insurance auth obtained and SNF bed available.  12-27-2023 stable for DC to SNF. WBAT. On ASA for dvt prophylaxis. F/U in ortho office([facility]) for dressing removal.  Macrocytic anemia Prior to 12-27-2023 admission HgB of 11.9. last iron panel done in 07-2023 negative for iron deficiency. Pt did not require PRBC transfusion.  12-27-2023 Vitamin B12 and RBC folate sent. Results pending.  Chronic hyponatremia Prior to 12-27-2023 baseline Na of 131-136  12-27-2023 discharge Na of 129. Stable.  Type 2 diabetes mellitus with hyperglycemia (HCC) Prior to 12-27-2023 On metformin at home. Placed on SSI  12-27-2023 A1c of 7.0 %, continue metformin at home.  Obstructive sleep apnea syndrome Prior to 12-27-2023 CPAP at bedtime.   12-27-2023 continue CPAP at bedtime.  Depressive disorder Prior to 12-27-2023 On Zyprexa  and Cymbalta /Atarax  at home  12-27-2023 continue zyprexa , cymbalta  and hydroxyzine  at SNF  Late onset Alzheimer's dementia without behavioral disturbance (HCC) Prior to 12-27-2023 chronic  12-27-2023 chronic. Stable. Continue Exelon  patch.  Hyperlipidemia Prior to 12-27-2023 on lipitor 10 mg daily  12-27-2023 continue lipitor  Gastroesophageal reflux disease without esophagitis Prior to 12-27-2023 on po protonix   12-27-2023  continue protonix  at SNF.  Essential hypertension Prior to 12-27-2023 on prn IV hydralazine   12-27-2023 stable without HTN meds  Coronary artery disease Prior to 12-27-2023 On aspirin  and atorvastatin  at home.  Will resume Lipitor for now.   12-27-2023 continue lipitor. On ASA for DVT prophylaxis and for CAD  DNR (do not resuscitate)/DNI(Do Not  Intubate)    DVT prophylaxis: SCDs Start: 12/21/23 2013  ASA per orthopedics recommendations     Code Status: Limited: Do not attempt resuscitation (DNR) -DNR-LIMITED -Do Not Intubate/DNI  Family  Communication: no family at bedside Disposition Plan: SNF Reason for continuing need for hospitalization: stable for DC.  Objective: Vitals:   12/26/23 0558 12/26/23 1329 12/26/23 1948 12/27/23 0400  BP: (!) 148/64 129/62 118/61 (!) 153/72  Pulse: 65 77 81 80  Resp: 16 16 16 17   Temp: 97.7 F (36.5 C) 98 F (36.7 C) 99.2 F (37.3 C) 98.8 F (37.1 C)  TempSrc: Oral Oral Oral Oral  SpO2: 98% 100% 97% 100%  Weight: 80.1 kg     Height:        Intake/Output Summary (Last 24 hours) at 12/27/2023 0937 Last data filed at 12/27/2023 0850 Gross per 24 hour  Intake 1440 ml  Output 1800 ml  Net -360 ml   Filed Weights   12/20/23 1002 12/26/23 0558  Weight: 95.3 kg 80.1 kg    Examination:  Physical Exam Vitals and nursing note reviewed.  Constitutional:      Comments: Awake but demented  HENT:     Head: Normocephalic and atraumatic.  Cardiovascular:     Rate and Rhythm: Normal rate and regular rhythm.  Pulmonary:     Effort: Pulmonary effort is normal. No respiratory distress.     Breath sounds: Normal breath sounds. No wheezing.  Abdominal:     General: Bowel sounds are normal. There is no distension.     Palpations: Abdomen is soft.  Skin:    General: Skin is warm and dry.     Capillary Refill: Capillary refill takes less than 2 seconds.     Comments: Small scab below right knee  Neurological:     General: No focal deficit present.     Mental Status: He is disoriented.     Data Reviewed: I have personally reviewed following labs and imaging studies  CBC: Recent Labs  Lab 12/20/23 1110 12/21/23 0516 12/23/23 0503 12/24/23 0446 12/25/23 0449 12/26/23 0435 12/27/23 0540  WBC 7.6   < > 9.5 8.5 8.3 7.4 10.0  NEUTROABS 5.4  --   --  6.6 6.1 5.8 7.8*  HGB 11.9*   < > 9.4* 10.0* 9.5* 10.6* 9.8*  HCT 38.0*   < > 28.8* 31.7* 30.2* 33.3* 30.8*  MCV 112.1*   < > 109.9* 112.8* 113.5* 109.9* 109.2*  PLT 158   < > 173 157 165 156 215   < > = values in this interval  not displayed.   Basic Metabolic Panel: Recent Labs  Lab 12/23/23 0503 12/24/23 0446 12/25/23 0449 12/26/23 0435 12/27/23 0540  NA 141 139 137 133* 129*  K 4.4 4.2 4.2 4.6 4.5  CL 109 111 106 102 97*  CO2 24 21* 24 22 20*  GLUCOSE 186* 192* 170* 187* 163*  BUN 32* 27* 28* 28* 23  CREATININE 1.01 0.80 1.01 0.91 0.92  CALCIUM  8.4* 8.1* 8.3* 8.3* 8.6*   GFR: Estimated Creatinine Clearance: 57.4 mL/min (by C-G formula based on SCr of 0.92 mg/dL). Liver Function Tests: Recent Labs  Lab 12/21/23 0516  AST 117*  ALT 109*  ALKPHOS 151*  BILITOT 1.0  PROT 6.5  ALBUMIN  2.6*   CBG: Recent Labs  Lab 12/25/23 2020 12/26/23 1140 12/26/23 1640 12/26/23 2145 12/27/23 0748  GLUCAP 273* 221* 166* 156* 161*   Recent Results (from the past 240 hours)  Surgical PCR  screen     Status: None   Collection Time: 12/20/23  6:07 PM   Specimen: Nasal Mucosa; Nasal Swab  Result Value Ref Range Status   MRSA, PCR NEGATIVE NEGATIVE Final   Staphylococcus aureus NEGATIVE NEGATIVE Final    Comment: (NOTE) The Xpert SA Assay (FDA approved for NASAL specimens in patients 5 years of age and older), is one component of a comprehensive surveillance program. It is not intended to diagnose infection nor to guide or monitor treatment. Performed at [facility], [address] [place], [place] [address]      Scheduled Meds:  aspirin  EC  325 mg Oral Q breakfast   atorvastatin   10 mg Oral Daily   cyanocobalamin   1,000 mcg Oral Daily   docusate sodium   100 mg Oral BID   DULoxetine   30 mg Oral Daily   feeding supplement  237 mL Oral BID BM   insulin  aspart  0-5 Units Subcutaneous QHS   insulin  aspart  0-9 Units Subcutaneous TID WC   montelukast   10 mg Oral QHS   OLANZapine   5 mg Oral BID   pantoprazole   40 mg Oral Daily   polyethylene glycol  17 g Oral BID   rivastigmine   9.5 mg Transdermal q AM   senna  1 tablet Oral BID   Continuous Infusions:   LOS: 7 days    Time spent: 55 minutes  Camellia Door, DO  [facility]  12/27/2023, 9:37 AM

## 2023-12-28 LAB — HEMATOLOGY COMMENTS:

## 2023-12-28 LAB — FOLATE RBC
Folate, Hemolysate: 393 ng/mL
Folate, RBC: UNDETERMINED ng/mL

## 2023-12-28 LAB — GLUCOSE, CAPILLARY: Glucose-Capillary: 175 mg/dL — ABNORMAL HIGH (ref 70–99)
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
## 2024-01-29 DEATH — deceased
